# Patient Record
Sex: Female | Born: 1950 | Race: White | Hispanic: No | Marital: Married | Smoking: Never smoker
Health system: Southern US, Community
[De-identification: ages and names within clinical notes are randomized; demographics above are authoritative.]

## PROBLEM LIST (undated history)

## (undated) DIAGNOSIS — E785 Hyperlipidemia, unspecified: Secondary | ICD-10-CM

## (undated) DIAGNOSIS — G4700 Insomnia, unspecified: Secondary | ICD-10-CM

## (undated) DIAGNOSIS — M722 Plantar fascial fibromatosis: Secondary | ICD-10-CM

## (undated) DIAGNOSIS — M8580 Other specified disorders of bone density and structure, unspecified site: Secondary | ICD-10-CM

## (undated) HISTORY — DX: Plantar fascial fibromatosis: M72.2

## (undated) HISTORY — PX: CHOLECYSTECTOMY: SHX55

## (undated) HISTORY — PX: TONSILLECTOMY AND ADENOIDECTOMY: SUR1326

## (undated) HISTORY — DX: Hyperlipidemia, unspecified: E78.5

## (undated) HISTORY — DX: Other specified disorders of bone density and structure, unspecified site: M85.80

## (undated) HISTORY — DX: Insomnia, unspecified: G47.00

## (undated) HISTORY — PX: VAGINAL HYSTERECTOMY: SUR661

---

## 1998-03-29 ENCOUNTER — Other Ambulatory Visit: Admission: RE | Admit: 1998-03-29 | Discharge: 1998-03-29 | Payer: Self-pay | Admitting: Obstetrics and Gynecology

## 1999-04-26 ENCOUNTER — Other Ambulatory Visit: Admission: RE | Admit: 1999-04-26 | Discharge: 1999-04-26 | Payer: Self-pay | Admitting: Obstetrics and Gynecology

## 1999-09-17 ENCOUNTER — Encounter: Payer: Self-pay | Admitting: Obstetrics and Gynecology

## 1999-09-17 ENCOUNTER — Encounter: Admission: RE | Admit: 1999-09-17 | Discharge: 1999-09-17 | Payer: Self-pay | Admitting: Obstetrics and Gynecology

## 1999-11-15 ENCOUNTER — Encounter (INDEPENDENT_AMBULATORY_CARE_PROVIDER_SITE_OTHER): Payer: Self-pay | Admitting: Specialist

## 1999-11-15 ENCOUNTER — Other Ambulatory Visit: Admission: RE | Admit: 1999-11-15 | Discharge: 1999-11-15 | Payer: Self-pay | Admitting: Obstetrics and Gynecology

## 2000-06-03 ENCOUNTER — Other Ambulatory Visit: Admission: RE | Admit: 2000-06-03 | Discharge: 2000-06-03 | Payer: Self-pay | Admitting: Obstetrics and Gynecology

## 2000-10-15 ENCOUNTER — Encounter: Admission: RE | Admit: 2000-10-15 | Discharge: 2000-10-15 | Payer: Self-pay | Admitting: Obstetrics and Gynecology

## 2000-10-15 ENCOUNTER — Encounter: Payer: Self-pay | Admitting: Obstetrics and Gynecology

## 2001-06-08 ENCOUNTER — Other Ambulatory Visit: Admission: RE | Admit: 2001-06-08 | Discharge: 2001-06-08 | Payer: Self-pay | Admitting: Obstetrics and Gynecology

## 2001-09-01 ENCOUNTER — Ambulatory Visit (HOSPITAL_COMMUNITY): Admission: RE | Admit: 2001-09-01 | Discharge: 2001-09-01 | Payer: Self-pay | Admitting: Gastroenterology

## 2001-09-01 ENCOUNTER — Encounter: Payer: Self-pay | Admitting: Gastroenterology

## 2001-10-13 ENCOUNTER — Observation Stay (HOSPITAL_COMMUNITY): Admission: RE | Admit: 2001-10-13 | Discharge: 2001-10-14 | Payer: Self-pay | Admitting: Surgery

## 2001-10-13 ENCOUNTER — Encounter (INDEPENDENT_AMBULATORY_CARE_PROVIDER_SITE_OTHER): Payer: Self-pay

## 2001-11-12 ENCOUNTER — Encounter: Payer: Self-pay | Admitting: Obstetrics and Gynecology

## 2001-11-12 ENCOUNTER — Encounter: Admission: RE | Admit: 2001-11-12 | Discharge: 2001-11-12 | Payer: Self-pay | Admitting: Obstetrics and Gynecology

## 2002-06-23 ENCOUNTER — Other Ambulatory Visit: Admission: RE | Admit: 2002-06-23 | Discharge: 2002-06-23 | Payer: Self-pay | Admitting: Obstetrics and Gynecology

## 2002-08-09 ENCOUNTER — Ambulatory Visit (HOSPITAL_COMMUNITY): Admission: RE | Admit: 2002-08-09 | Discharge: 2002-08-09 | Payer: Self-pay | Admitting: Gastroenterology

## 2002-12-27 ENCOUNTER — Encounter: Admission: RE | Admit: 2002-12-27 | Discharge: 2002-12-27 | Payer: Self-pay | Admitting: Obstetrics and Gynecology

## 2003-07-25 ENCOUNTER — Other Ambulatory Visit: Admission: RE | Admit: 2003-07-25 | Discharge: 2003-07-25 | Payer: Self-pay | Admitting: Obstetrics and Gynecology

## 2004-03-16 ENCOUNTER — Encounter: Admission: RE | Admit: 2004-03-16 | Discharge: 2004-03-16 | Payer: Self-pay | Admitting: Obstetrics and Gynecology

## 2004-09-19 ENCOUNTER — Other Ambulatory Visit: Admission: RE | Admit: 2004-09-19 | Discharge: 2004-09-19 | Payer: Self-pay | Admitting: Obstetrics and Gynecology

## 2005-04-16 ENCOUNTER — Encounter: Admission: RE | Admit: 2005-04-16 | Discharge: 2005-04-16 | Payer: Self-pay | Admitting: Obstetrics and Gynecology

## 2006-05-01 ENCOUNTER — Encounter: Admission: RE | Admit: 2006-05-01 | Discharge: 2006-05-01 | Payer: Self-pay | Admitting: Obstetrics and Gynecology

## 2007-06-16 ENCOUNTER — Encounter: Admission: RE | Admit: 2007-06-16 | Discharge: 2007-06-16 | Payer: Self-pay | Admitting: Internal Medicine

## 2008-06-20 ENCOUNTER — Encounter: Admission: RE | Admit: 2008-06-20 | Discharge: 2008-06-20 | Payer: Self-pay | Admitting: Internal Medicine

## 2009-10-04 ENCOUNTER — Inpatient Hospital Stay (HOSPITAL_COMMUNITY): Admission: RE | Admit: 2009-10-04 | Discharge: 2009-10-05 | Payer: Self-pay | Admitting: Obstetrics and Gynecology

## 2009-10-04 ENCOUNTER — Encounter (INDEPENDENT_AMBULATORY_CARE_PROVIDER_SITE_OTHER): Payer: Self-pay | Admitting: Obstetrics and Gynecology

## 2009-10-24 ENCOUNTER — Encounter: Admission: RE | Admit: 2009-10-24 | Discharge: 2009-10-24 | Payer: Self-pay | Admitting: Obstetrics and Gynecology

## 2009-10-27 ENCOUNTER — Encounter: Admission: RE | Admit: 2009-10-27 | Discharge: 2009-10-27 | Payer: Self-pay | Admitting: Obstetrics and Gynecology

## 2010-03-11 ENCOUNTER — Encounter: Payer: Self-pay | Admitting: Obstetrics and Gynecology

## 2010-05-04 LAB — CBC
MCH: 29.7 pg (ref 26.0–34.0)
MCHC: 33.5 g/dL (ref 30.0–36.0)
MCV: 89.8 fL (ref 78.0–100.0)
Platelets: 250 10*3/uL (ref 150–400)
Platelets: 264 10*3/uL (ref 150–400)
RDW: 13.6 % (ref 11.5–15.5)
RDW: 14 % (ref 11.5–15.5)
WBC: 13 10*3/uL — ABNORMAL HIGH (ref 4.0–10.5)

## 2010-05-04 LAB — SURGICAL PCR SCREEN
MRSA, PCR: NEGATIVE
Staphylococcus aureus: NEGATIVE

## 2010-07-06 NOTE — Op Note (Signed)
   NAME:  Teresa Lamb, Teresa Lamb                      ACCOUNT NO.:  [id number]   MEDICAL RECORD NO.:  [id number]                   PATIENT TYPE:  AMB   LOCATION:  ENDO                                 FACILITY:  [facility]   PHYSICIAN:  Anselmo Rod, M.D.               DATE OF BIRTH:  1951/02/14   DATE OF PROCEDURE:  08/09/2002  DATE OF DISCHARGE:                                 OPERATIVE REPORT   PROCEDURE PERFORMED:  Screening colonoscopy.   ENDOSCOPIST:  Charna Elizabeth, M.D.   INSTRUMENT USED:  Olympus video colonoscope.   INDICATIONS FOR PROCEDURE:  The patient is a 60 year old white female  undergoing screening colonoscopy to rule out colonic polyps, masses, etc.   PREPROCEDURE PREPARATION:  Informed consent was procured from the patient.  The patient was fasted for eight hours prior to the procedure and prepped  with a bottle of magnesium citrate and a gallon of GoLYTELY the night prior  to the procedure.   PREPROCEDURE PHYSICAL:  The patient had stable vital signs.  Neck supple.  Chest clear to auscultation.  S1 and S2 regular.  Abdomen soft with normal  bowel sounds.   DESCRIPTION OF PROCEDURE:  The patient was placed in left lateral decubitus  position and sedated with 40 mg of Demerol and 4 mg of Versed intravenously.  Once the patient was adequately sedated and maintained on low flow oxygen  and continuous cardiac monitoring, the Olympus video colonoscope was  advanced from the rectum to the cecum.  The appendicular orifice and  ileocecal valve were clearly visualized and photographed.  No masses,  polyps, erosions, ulcerations or arteriovenous malformations were seen.  There was evidence of left-sided diverticulosis.  Retroflexion in the rectum  revealed no abnormalities.   IMPRESSION:  Normal colonoscopy up to the cecum except for a few left-sided  diverticula.   RECOMMENDATIONS:  1. A high fiber diet with liberal fluid intake has been advocated.  2. Repeat colorectal  cancer screening is recommended in the next 10 years     unless the patient develops any abnormal symptoms in the interim.  3. Outpatient follow-up as the need arises in the future.                                                   Anselmo Rod, M.D.    JNM/MEDQ  D:  08/09/2002  T:  08/10/2002  Job:  [id number]   cc:   Theressa Millard, M.D.  [address] [place]  [place] [address]  Fax: [phone]

## 2010-07-06 NOTE — Op Note (Signed)
TNAMECHIYEKO, FERRE                     ACCOUNT NO.:  [id number]   MEDICAL RECORD NO.:  [id number]                   PATIENT TYPE:  OBV   LOCATION:  0363                                 FACILITY:  [facility]   PHYSICIAN:  Douglas A. Magnus Ivan, M.D.           DATE OF BIRTH:  [birth year]   DATE OF PROCEDURE:  10/13/2001  DATE OF DISCHARGE:  10/14/2001                                 OPERATIVE REPORT   PREOPERATIVE DIAGNOSES:  Symptomatic cholelithiasis.   POSTOPERATIVE DIAGNOSES:  Symptomatic cholelithiasis.   PROCEDURE:  Laparoscopic cholecystectomy.   SURGEON:  Douglas A. Magnus Ivan, M.D.   ASSISTANT:  Donnie Coffin. Samuella Cota, M.D.   ANESTHESIA:  General endotracheal anesthesia and 0.25% Marcaine.   ESTIMATED BLOOD LOSS:  Minimal.   INDICATIONS FOR PROCEDURE:  Teresa Lamb is a 61 year old female who  presented with right upper quadrant epigastric abdominal pain and nausea  after a fatty meal. She was found on ultrasound to have cholelithiasis with  normal common bile duct. A decision was then made to proceed with the  laparoscopic cholecystectomy.   FINDINGS:  The patient was found to have a deep walled gallbladder with the  appearance of chronic cholecystitis as well as multiple gallstones.   DESCRIPTION OF PROCEDURE:  The patient was brought to the operating room,  identified as Teresa Lamb. She was placed supine on the operating table  and general anesthesia was induced. Her abdomen was then prepped and draped  in the usual sterile fashion. Using a #15 blade, a small transverse incision  was made below the umbilicus. The incision was carried down through the  fascia and opened with the scalpel. A hemostat was then used to pass through  the peritoneal cavity. A #0 Vicryl pursestring suture was then placed around  the fascial opening. The Hasson port was placed through the opening and  insufflation of the abdomen was begun. Next, an 11 mm port was placed in the  patient's epigastrium and two 5 mm ports were placed in the patient's right  flank under direct vision. The gallbladder was then identified and retracted  above the liver bed. It was found to be thick walled. The cystic duct was  then dissected out, clipped twice proximally, once distally and transected  with the scissors. The cystic artery was then identified, clipped twice  proximally, once distally and transected as well. The gallbladder was then  easily dissected free from the liver bed with the electrocautery. Hemostasis  was achieved in the liver bed with the cautery. Once the gallbladder was  completely excised from the liver bed, it was pulled out through the  incision at the umbilicus. The #0 Vicryl __________  in place closing the  fascial defect. Again the liver bed was examined and hemostasis was  achieved. All ports were then removed under direct vision and the abdomen  was deflated. All incisions were then anesthetized with 0.25% Marcaine and  closed with 4-0 Monocryl subcuticular  sutures. Steri-  Strips, gauze and tape were then applied. The patient tolerated the  procedure well. All sponge, needle and instrument counts were correct at the  end of the procedure. The patient was then extubated in the operating room  and taken in stable condition to the recovery room.                                               Douglas A. Magnus Ivan, M.D.    DAB/MEDQ  D:  10/13/2001  T:  10/15/2001  Job:  [id number]

## 2010-10-10 ENCOUNTER — Other Ambulatory Visit: Payer: Self-pay | Admitting: Obstetrics and Gynecology

## 2010-10-10 DIAGNOSIS — Z1231 Encounter for screening mammogram for malignant neoplasm of breast: Secondary | ICD-10-CM

## 2010-11-01 ENCOUNTER — Ambulatory Visit
Admission: RE | Admit: 2010-11-01 | Discharge: 2010-11-01 | Disposition: A | Discharge status: Home / Self Care | Payer: BC Managed Care – PPO | Source: Ambulatory Visit | Attending: Obstetrics and Gynecology | Admitting: Obstetrics and Gynecology

## 2010-11-01 DIAGNOSIS — Z1231 Encounter for screening mammogram for malignant neoplasm of breast: Secondary | ICD-10-CM

## 2011-03-12 ENCOUNTER — Ambulatory Visit: Payer: BC Managed Care – PPO | Attending: Orthopaedic Surgery | Admitting: Physical Therapy

## 2011-03-12 DIAGNOSIS — M25569 Pain in unspecified knee: Secondary | ICD-10-CM | POA: Insufficient documentation

## 2011-03-12 DIAGNOSIS — IMO0001 Reserved for inherently not codable concepts without codable children: Secondary | ICD-10-CM | POA: Insufficient documentation

## 2011-03-15 ENCOUNTER — Ambulatory Visit: Payer: BC Managed Care – PPO | Admitting: Physical Therapy

## 2011-03-20 ENCOUNTER — Ambulatory Visit: Payer: BC Managed Care – PPO | Admitting: Physical Therapy

## 2011-03-28 ENCOUNTER — Ambulatory Visit: Payer: BC Managed Care – PPO | Attending: Orthopaedic Surgery | Admitting: Physical Therapy

## 2011-03-28 DIAGNOSIS — M25569 Pain in unspecified knee: Secondary | ICD-10-CM | POA: Insufficient documentation

## 2011-03-28 DIAGNOSIS — IMO0001 Reserved for inherently not codable concepts without codable children: Secondary | ICD-10-CM | POA: Insufficient documentation

## 2012-01-06 ENCOUNTER — Other Ambulatory Visit: Payer: Self-pay | Admitting: Obstetrics and Gynecology

## 2012-01-06 DIAGNOSIS — Z1231 Encounter for screening mammogram for malignant neoplasm of breast: Secondary | ICD-10-CM

## 2012-01-09 ENCOUNTER — Ambulatory Visit
Admission: RE | Admit: 2012-01-09 | Discharge: 2012-01-09 | Disposition: A | Discharge status: Home / Self Care | Payer: BC Managed Care – PPO | Source: Ambulatory Visit | Attending: Obstetrics and Gynecology | Admitting: Obstetrics and Gynecology

## 2012-01-09 DIAGNOSIS — Z1231 Encounter for screening mammogram for malignant neoplasm of breast: Secondary | ICD-10-CM

## 2013-01-13 ENCOUNTER — Other Ambulatory Visit: Payer: Self-pay

## 2013-01-13 DIAGNOSIS — Z1231 Encounter for screening mammogram for malignant neoplasm of breast: Secondary | ICD-10-CM

## 2013-02-24 ENCOUNTER — Ambulatory Visit: Payer: BC Managed Care – PPO

## 2013-03-01 ENCOUNTER — Ambulatory Visit: Payer: BC Managed Care – PPO

## 2013-03-12 ENCOUNTER — Ambulatory Visit
Admission: RE | Admit: 2013-03-12 | Discharge: 2013-03-12 | Disposition: A | Discharge status: Home / Self Care | Payer: 59 | Source: Ambulatory Visit

## 2013-03-12 DIAGNOSIS — Z1231 Encounter for screening mammogram for malignant neoplasm of breast: Secondary | ICD-10-CM

## 2013-09-09 ENCOUNTER — Ambulatory Visit (INDEPENDENT_AMBULATORY_CARE_PROVIDER_SITE_OTHER): Payer: 59 | Admitting: Sports Medicine

## 2013-09-09 ENCOUNTER — Encounter: Payer: Self-pay | Admitting: Sports Medicine

## 2013-09-09 VITALS — BP 128/82 | Ht 65.0 in | Wt 175.0 lb

## 2013-09-09 DIAGNOSIS — M25579 Pain in unspecified ankle and joints of unspecified foot: Secondary | ICD-10-CM

## 2013-09-09 DIAGNOSIS — M7750 Other enthesopathy of unspecified foot: Secondary | ICD-10-CM

## 2013-09-09 DIAGNOSIS — M25572 Pain in left ankle and joints of left foot: Secondary | ICD-10-CM | POA: Insufficient documentation

## 2013-09-09 NOTE — Progress Notes (Signed)
  Teresa Lamb - 63 y.o. female MRN [id number]  Date of birth: 26-Apr-1950    SUBJECTIVE:     Teresa Lamb comes in today for evaluation of lateral left ankle pain and swelling, which has been occurring for approximately a year.  The pain is described as achy and worse with physical activity.  She denies any trauma prior to the pain, or previous surgeries.  Does note history of right Achilles tendinitis and likely and favored her right leg just prior to her left ankle pain beginning.  Additionally, she notices a sharp, stinging sensation in her lateral leg with some movements.  She is tried Advil with some relief.   Has seen Dr Doran Durand Also has tried to use some OTC insoles Shoes with arches are helpful  Referred by friends in [place] who are patients here  ROS:     No fevers, chills, or erythema  PERTINENT  PMH / PSH FH / / SH:  Past Medical, Surgical, Social, and Family History Reviewed & Updated in the EMR.  Pertinent findings include:   History of right Achilles tendinitis  OBJECTIVE: BP 128/82  Ht 5\' 5"  (1.651 m)  Wt 175 lb (79.379 kg)  BMI 29.12 kg/m2  Physical Exam:  Vital signs are reviewed.  Left Ankle & Foot: Inspection:   Moderate swelling inferior to the lateral malleolus; normal longitudinal arch with collapse upon standing; hammer toes noted on fourth and fifth digits; mod. DJDdeformity with first metatarsal bony enlargement  Mild ankle hallux valgus  Palpation: Lateral ankle tenderness inferior and distal to lateral malleolus  ROM: Full in plantarflexion, dorsiflexion, inversion, and eversion of the foot; normal subtalar motion  Hallux rigidus present: Approximately 5 first MTP extension  Stable lateral and medial ligaments; Negative Anterior drawer test  Gait  Left mid foot pronation with out-toeing  Leg length: equal  Left ankle X-rays reviewed  Arthritic changes in first MTP joint as well as talocuboid joint, as well as lateral soft tissue  swelling  ASSESSMENT & PLAN:  See problem based charting & AVS for pt instructions.

## 2013-09-09 NOTE — Assessment & Plan Note (Addendum)
Patient fitted with  scaphoid pads over her OTC orthotics.  Left foot pronation corrected to neutral with inserts - Advised patient on wearing shoes with good arch support - Fitted with Left ankle compression sleeve - Followup in 3 weeks for custom orthotics  We need to see if she tolerates better arch support To see if compression reduces swelling  PRN meds but may add these if conservative measures are not enough

## 2013-09-16 ENCOUNTER — Ambulatory Visit: Payer: 59 | Admitting: Sports Medicine

## 2013-10-26 ENCOUNTER — Ambulatory Visit (INDEPENDENT_AMBULATORY_CARE_PROVIDER_SITE_OTHER): Payer: 59 | Admitting: Sports Medicine

## 2013-10-26 ENCOUNTER — Encounter: Payer: Self-pay | Admitting: Sports Medicine

## 2013-10-26 VITALS — BP 115/77 | HR 69 | Ht 65.0 in | Wt 175.0 lb

## 2013-10-26 DIAGNOSIS — M25572 Pain in left ankle and joints of left foot: Secondary | ICD-10-CM

## 2013-10-26 DIAGNOSIS — M7750 Other enthesopathy of unspecified foot: Secondary | ICD-10-CM

## 2013-10-26 DIAGNOSIS — R269 Unspecified abnormalities of gait and mobility: Secondary | ICD-10-CM

## 2013-10-26 NOTE — Assessment & Plan Note (Signed)
Patient was fitted for orthotics to help correct her pronated gait a collapsing medial purchase. Orthotics were made and placed the patient in more neutral position. On gait analysis she also has some left external rotation. Provided patient with hip strengthening exercises for her external rotators and abductors.  Recommended follow up in 3 months for reassessment

## 2013-10-26 NOTE — Progress Notes (Signed)
  Teresa Lamb - 62 y.o. female MRN [id number]  Date of birth: [birth year]   SUBJECTIVE:     Teresa Lamb comes in today for evaluation of lateral left ankle pain and swelling, which has been occurring for approximately a year.  The pain is described as achy and worse with physical activity.  She denies any trauma prior to the pain, or previous surgeries. She is tried Advil with some relief. The patient was seen in July and fitted for scaphoid pads for her over-the-counter orthotics. This helped her left foot pronation be corrected to neutral. She also was given ankle compression sleeve. She reports some improvement in her sinus tarsi swelling from ankle compression sleeve. The  scaphoid padding did improve her arch support and took less pressure over her sinus tarsi but did cause some pulling of the ankle towards the lateral aspect of her left foot.  ROS:     No fevers, chills, or erythema  PERTINENT  PMH / PSH FH / / SH:  Past Medical, Surgical, Social, and Family History Reviewed & Updated in the EMR.  Pertinent findings include:   History of right Achilles tendinitis  OBJECTIVE: BP 115/77  Ht 5\' 5"  (1.651 m)  Wt 175 lb (79.379 kg)  BMI 29.12 kg/m2  Physical Exam:  Vital signs are reviewed.  Left Ankle & Foot: Inspection:   Moderate swelling of the sinus tarsi, normal longitudinal arch with collapse upon standing; hammer toes noted on fourth and fifth digits; mod.   DJDdeformity with first metatarsal bony enlargement and valgus position of 1st ray Palpation: Lateral ankle tenderness over sinus tarsi  ROM: Full in plantarflexion, dorsiflexion, inversion, and eversion of the foot; normal subtalar motion  Hallux rigidus present: Approximately 5 first MTP extension  Stable lateral and medial ligaments; Negative Anterior drawer test  Gait  Left mid foot pronation with out-toeing and left leg external rotator and positive Trendelenburg sign  Leg length: equal  Left hip weakness  with external rotators and abductors  Left ankle X-rays reviewed  Arthritic changes in first MTP joint as well as talocuboid joint, as well as lateral soft tissue swelling   ASSESSMENT & PLAN: See problem based charting & AVS for pt instructions.  Orthotic Fitting and Adjustment note: Patient was fitted for a : standard, cushioned, semi-rigid orthotic.  The orthotic was heated and afterward the patient stood on the orthotic blank positioned on the orthotic stand.  The patient was positioned in subtalar neutral position and 10 degrees of ankle dorsiflexion in a weight bearing stance.  After completion of molding, a stable base was applied to the orthotic blank.  The blank was ground to a stable position for weight bearing.  Size: 8 Base: blue foam Posting: none Additional orthotic padding: none  Greater than 50% of the patient's visit was conducted with face-to-face counseling for bilateral foot orthotics fitting and constructing

## 2014-04-28 ENCOUNTER — Other Ambulatory Visit: Payer: Self-pay

## 2014-04-28 DIAGNOSIS — Z1231 Encounter for screening mammogram for malignant neoplasm of breast: Secondary | ICD-10-CM

## 2014-05-03 ENCOUNTER — Ambulatory Visit
Admission: RE | Admit: 2014-05-03 | Discharge: 2014-05-03 | Disposition: A | Discharge status: Home / Self Care | Payer: 59 | Source: Ambulatory Visit

## 2014-05-03 DIAGNOSIS — Z1231 Encounter for screening mammogram for malignant neoplasm of breast: Secondary | ICD-10-CM

## 2014-05-04 ENCOUNTER — Other Ambulatory Visit: Payer: Self-pay | Admitting: Internal Medicine

## 2014-05-04 DIAGNOSIS — R928 Other abnormal and inconclusive findings on diagnostic imaging of breast: Secondary | ICD-10-CM

## 2014-05-06 ENCOUNTER — Ambulatory Visit
Admission: RE | Admit: 2014-05-06 | Discharge: 2014-05-06 | Disposition: A | Discharge status: Home / Self Care | Payer: 59 | Source: Ambulatory Visit | Attending: Internal Medicine | Admitting: Internal Medicine

## 2014-05-06 DIAGNOSIS — R928 Other abnormal and inconclusive findings on diagnostic imaging of breast: Secondary | ICD-10-CM

## 2014-05-11 ENCOUNTER — Other Ambulatory Visit: Payer: Self-pay | Admitting: Internal Medicine

## 2014-05-11 DIAGNOSIS — H539 Unspecified visual disturbance: Secondary | ICD-10-CM

## 2014-05-18 ENCOUNTER — Ambulatory Visit
Admission: RE | Admit: 2014-05-18 | Discharge: 2014-05-18 | Disposition: A | Discharge status: Home / Self Care | Payer: 59 | Source: Ambulatory Visit | Attending: Internal Medicine | Admitting: Internal Medicine

## 2014-05-18 DIAGNOSIS — H539 Unspecified visual disturbance: Secondary | ICD-10-CM

## 2014-06-01 ENCOUNTER — Telehealth: Payer: Self-pay | Admitting: Cardiology

## 2014-06-01 NOTE — Telephone Encounter (Signed)
Received records from [facility] for appointment with Dr Percival Spanish on 07/12/14.  Records given to [facility] (medical records) for Dr Hochrein's schedule on 07/12/14. lp

## 2014-07-12 ENCOUNTER — Ambulatory Visit: Payer: 59 | Admitting: Cardiology

## 2014-07-15 ENCOUNTER — Ambulatory Visit (INDEPENDENT_AMBULATORY_CARE_PROVIDER_SITE_OTHER): Payer: 59 | Admitting: Cardiology

## 2014-07-15 ENCOUNTER — Encounter: Payer: Self-pay | Admitting: Cardiology

## 2014-07-15 VITALS — BP 143/80 | HR 78 | Ht 64.0 in | Wt 177.4 lb

## 2014-07-15 DIAGNOSIS — R072 Precordial pain: Secondary | ICD-10-CM | POA: Diagnosis not present

## 2014-07-15 NOTE — Patient Instructions (Signed)
Your physician recommends that you schedule a follow-up appointment in: AS NEEDED WITH DR. HOCHREIN  We are ordering a stress test for you to get done

## 2014-07-15 NOTE — Progress Notes (Signed)
Cardiology Office Note   Date:  07/15/2014   ID:  Teresa Lamb [birth year], MRN [id number]  PCP:  Dorian Heckle, MD  Cardiologist:   Minus Breeding, MD   Chief Complaint  Patient presents with  . Chest Pain      History of Present Illness: Teresa Lamb is a 65 y.o. female who presents for evaluation of chest discomfort. She has no prior cardiac history.  However, she's had chest discomfort. She doesn't know how long this has been going on. It happens at rest. It is sporadic. It is somewhat of a stable pattern. He describes it as moderate in intensity and lasting about 5 minutes. She does some walking for exercise and cannot bring this on. When she gets it she denies any radiation to her jaw or to her arms. There are no associated symptoms. She otherwise does relatively well. She has some ankle pain which has limited her walking. However, she denies any other significant cardiovascular symptoms. She has no palpitations, presyncope or syncope. She has no PND or orthopnea. She has had some weight gain. She has not had any prior cardiac workup.  Past Medical History  Diagnosis Date  . Insomnia   . Osteopenia   . Plantar fasciitis   . Hyperlipemia     Past Surgical History  Procedure Laterality Date  . Tonsillectomy and adenoidectomy    . Cholecystectomy    . Vaginal hysterectomy       Meds:  None   No current facility-administered medications for this visit.    Allergies:   Penicillins    Social History:  The patient  reports that she has never smoked. She does not have any smokeless tobacco history on file.   Family History:  The patient's family history includes Atrial fibrillation in her father; Hypertension in her mother.    ROS:  Please see the history of present illness.   Otherwise, review of systems are positive for none.   All other systems are reviewed and negative.    PHYSICAL EXAM: VS:  BP 143/80 mmHg  Pulse 78  Ht 5\' 4"  (1.626 m)   Wt 177 lb 6.4 oz (80.468 kg)  BMI 30.44 kg/m2 , BMI Body mass index is 30.44 kg/(m^2). GENERAL:  Well appearing HEENT:  Pupils equal round and reactive, fundi not visualized, oral mucosa unremarkable NECK:  No jugular venous distention, waveform within normal limits, carotid upstroke brisk and symmetric, no bruits, no thyromegaly LYMPHATICS:  No cervical, inguinal adenopathy LUNGS:  Clear to auscultation bilaterally BACK:  No CVA tenderness CHEST:  Unremarkable HEART:  PMI not displaced or sustained,S1 and S2 within normal limits, no S3, no S4, no clicks, no rubs, no murmurs ABD:  Flat, positive bowel sounds normal in frequency in pitch, no bruits, no rebound, no guarding, no midline pulsatile mass, no hepatomegaly, no splenomegaly EXT:  2 plus pulses throughout, no edema, no cyanosis no clubbing SKIN:  No rashes no nodules NEURO:  Cranial nerves II through XII grossly intact, motor grossly intact throughout PSYCH:  Cognitively intact, oriented to person place and time    EKG:  EKG is ordered today. The ekg ordered today demonstrates sinus rhythm, rate 78, axis within normal limits, intervals within normal limits, no acute ST-T wave changes.     Wt Readings from Last 3 Encounters:  07/15/14 177 lb 6.4 oz (80.468 kg)  10/26/13 175 lb (79.379 kg)  09/09/13 175 lb (79.379 kg)      Other  studies Reviewed: Additional studies/ records that were reviewed today include: Outside records. Review of the above records demonstrates:  Please see elsewhere in the note.     ASSESSMENT AND PLAN:  CHEST PAIN:  The pretest prob of obstructive CAD is very low.  I will bring the patient back for a POET (Plain Old Exercise Test). This will allow me to screen for obstructive coronary disease, risk stratify and very importantly provide a prescription for exercise.  We talked about primary risk reduction.  HTN:  She does not carry this diagnosis. Blood pressures borderline. We can watch this at the  time of her stress test. I think she could best be treated with therapeutic lifestyle changes.   Current medicines are reviewed at length with the patient today.  The patient does not have concerns regarding medicines.  The following changes have been made:  no change  Labs/ tests ordered today include:   Orders Placed This Encounter  Procedures  . Exercise Tolerance Test  . EKG 12-Lead     Disposition:   FU with me as needed    Signed, Minus Breeding, MD  07/15/2014 5:23 PM    [place]

## 2014-07-27 ENCOUNTER — Other Ambulatory Visit: Payer: Self-pay | Admitting: Internal Medicine

## 2014-07-27 DIAGNOSIS — E041 Nontoxic single thyroid nodule: Secondary | ICD-10-CM

## 2014-07-29 ENCOUNTER — Other Ambulatory Visit: Payer: 59

## 2014-08-03 ENCOUNTER — Encounter: Payer: Self-pay | Admitting: Cardiology

## 2014-08-03 ENCOUNTER — Ambulatory Visit
Admission: RE | Admit: 2014-08-03 | Discharge: 2014-08-03 | Disposition: A | Discharge status: Home / Self Care | Payer: 59 | Source: Ambulatory Visit | Attending: Internal Medicine | Admitting: Internal Medicine

## 2014-08-03 DIAGNOSIS — E041 Nontoxic single thyroid nodule: Secondary | ICD-10-CM

## 2014-08-12 ENCOUNTER — Telehealth (HOSPITAL_COMMUNITY): Payer: Self-pay

## 2014-08-12 NOTE — Telephone Encounter (Signed)
Encounter complete. 

## 2014-08-15 ENCOUNTER — Encounter (HOSPITAL_COMMUNITY): Payer: Self-pay | Admitting: *Deleted

## 2014-08-16 ENCOUNTER — Telehealth (HOSPITAL_COMMUNITY): Payer: Self-pay

## 2014-08-16 ENCOUNTER — Telehealth (HOSPITAL_COMMUNITY): Payer: Self-pay | Admitting: *Deleted

## 2014-08-16 NOTE — Telephone Encounter (Signed)
Encounter complete. 

## 2014-08-17 ENCOUNTER — Ambulatory Visit (HOSPITAL_COMMUNITY)
Admission: RE | Admit: 2014-08-17 | Discharge: 2014-08-17 | Disposition: A | Discharge status: Home / Self Care | Payer: 59 | Source: Ambulatory Visit | Attending: Internal Medicine | Admitting: Internal Medicine

## 2014-08-17 DIAGNOSIS — R072 Precordial pain: Secondary | ICD-10-CM | POA: Diagnosis not present

## 2014-08-17 LAB — EXERCISE TOLERANCE TEST
CHL RATE OF PERCEIVED EXERTION: 16
Estimated workload: 7.8 METS
Exercise duration (min): 6 min
Exercise duration (sec): 30 s
MPHR: 157 {beats}/min
Peak HR: 141 {beats}/min
Percent HR: 89 %
Rest HR: 83 {beats}/min

## 2014-09-01 ENCOUNTER — Encounter: Payer: Self-pay | Admitting: Sports Medicine

## 2014-09-01 ENCOUNTER — Ambulatory Visit (INDEPENDENT_AMBULATORY_CARE_PROVIDER_SITE_OTHER): Payer: 59 | Admitting: Sports Medicine

## 2014-09-01 VITALS — BP 114/61 | HR 70 | Ht 64.0 in | Wt 177.0 lb

## 2014-09-01 DIAGNOSIS — G5752 Tarsal tunnel syndrome, left lower limb: Secondary | ICD-10-CM | POA: Diagnosis not present

## 2014-09-01 DIAGNOSIS — M79672 Pain in left foot: Secondary | ICD-10-CM | POA: Diagnosis not present

## 2014-09-01 DIAGNOSIS — M25572 Pain in left ankle and joints of left foot: Secondary | ICD-10-CM

## 2014-09-01 NOTE — Assessment & Plan Note (Signed)
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

## 2014-09-01 NOTE — Patient Instructions (Signed)
You have some arthritis in your mid foot and in both great toes  I made an adjustment to your orthotics  I also suggested trying an arch strap  Arnica gel rubbed into the foot several times per day may help with pain  Warm bath soaks at end of day may also help  See how these help and I can try other treatments if we are not getting enough relief

## 2014-09-01 NOTE — Progress Notes (Signed)
Patient ID: Teresa Lamb, female   DOB: 05-02-1950, 64 y.o.   MRN: [id number]  Patient was seen last year and put in orthotics Her x-ray showed some midfoot arthritis She has some chronic swelling in the sinus tarsi The orthotics helped for while but in the last couple months she said an increase in pain This limits her walking Pain is more on the outside of the foot No real swelling No new injury  Physical examination No acute distress BP 114/61 mmHg  Pulse 70  Ht 5\' 4"  (1.626 m)  Wt 177 lb (80.287 kg)  BMI 30.37 kg/m2  Orthotics are intact but on the left side she actually placed an extra insole which makes the leg lengths are unequal walking she is getting too much Supination at the heel  Swelling over the left sinus tarsi more so than the right Normal ankle motion Some tenderness at the talocuboid joint

## 2014-09-06 ENCOUNTER — Encounter: Payer: Self-pay | Admitting: Endocrinology

## 2014-09-06 ENCOUNTER — Ambulatory Visit (INDEPENDENT_AMBULATORY_CARE_PROVIDER_SITE_OTHER): Payer: 59 | Admitting: Endocrinology

## 2014-09-06 VITALS — BP 122/78 | HR 89 | Temp 98.7°F | Resp 16 | Ht 65.0 in | Wt 177.0 lb

## 2014-09-06 DIAGNOSIS — E041 Nontoxic single thyroid nodule: Secondary | ICD-10-CM | POA: Diagnosis not present

## 2014-09-06 LAB — TSH: TSH: 1.53 u[IU]/mL (ref 0.35–4.50)

## 2014-09-06 NOTE — Patient Instructions (Addendum)
Let's check a thyroid biopsy, guided by ultrasound.  If no cancer is found, please come back for a follow-up appointment in 6-12 months.

## 2014-09-06 NOTE — Progress Notes (Signed)
Subjective:    Patient ID: Teresa Lamb, female    DOB: [birth year], 64 y.o.   MRN: [id number]  HPI Pt was noted to have a nodule at the thyroid in early 2016.  He has no h/o XRT or surgery to the neck.  She is unaware of ever having had a thyroid problem before.  She reports slight swelling at the anterior neck, but no assoc neck pain.   Past Medical History  Diagnosis Date  . Insomnia   . Osteopenia   . Plantar fasciitis   . Hyperlipemia     Past Surgical History  Procedure Laterality Date  . Tonsillectomy and adenoidectomy    . Cholecystectomy    . Vaginal hysterectomy      History   Social History  . Marital Status: Married    Spouse Name: N/A  . Number of Children: 3  . Years of Education: N/A   Occupational History  . Not on file.   Social History Main Topics  . Smoking status: Never Smoker   . Smokeless tobacco: Not on file  . Alcohol Use: Not on file  . Drug Use: Not on file  . Sexual Activity: Not on file   Other Topics Concern  . Not on file   Social History Narrative   Lives at home with husband.  3 children.  Six grands and 3 coming.      Current Outpatient Prescriptions on File Prior to Visit  Medication Sig Dispense Refill  . Vitamin D, Ergocalciferol, (DRISDOL) 50000 UNITS CAPS capsule Take 50,000 Units by mouth once a week.  0   No current facility-administered medications on file prior to visit.    Allergies  Allergen Reactions  . Penicillins     Family History  Problem Relation Age of Onset  . Atrial fibrillation Father   . Hypertension Mother   . Thyroid disease Neg Hx     BP 122/78 mmHg  Pulse 89  Temp(Src) 98.7 F (37.1 C) (Oral)  Resp 16  Ht 5\' 5"  (1.651 m)  Wt 177 lb (80.287 kg)  BMI 29.45 kg/m2  SpO2 97%    Review of Systems  HENT: Negative for sore throat.   Cardiovascular: Negative for leg swelling.  Gastrointestinal:       She has slight heartburn  Endocrine: Negative for cold intolerance.  Skin:  Negative for rash.  Neurological: Negative for syncope.   Denies dysphagia, hoarseness, easy bruising, and sob.  She has weight gain.      Objective:   Physical Exam VS: see vs page GEN: no distress HEAD: no periorbital swelling, no proptosis NECK: I cannot feel the thyroid nodule CHEST WALL: no deformity. MUSCULOSKELETAL: muscle bulk and strength are grossly normal.  no obvious joint swelling.  gait is normal and steady NEURO:  cn 2-12 grossly intact.   readily moves all 4's.   SKIN:  Normal texture and temperature.     NODES:  None palpable at the neck PSYCH: alert, well-oriented.  Does not appear anxious nor depressed.   outside test results are reviewed: TSH=normal (April, 2016)  Radiol: thyroid US (08/03/14): The nodule in the right lobe has increased from 13 mm to 17 mm since the 05/18/14 carotid US.    i personally reviewed electrocardiogram tracing (07/15/14): normal    Assessment & Plan:  Multinodular goiter, new to me, uncertain etiology.  i can't feel it well enough to do bx by palpation.    Patient is advised the following:  Patient Instructions  Let's check a thyroid biopsy, guided by ultrasound.  If no cancer is found, please come back for a follow-up appointment in 6-12 months.

## 2014-09-07 ENCOUNTER — Ambulatory Visit
Admission: RE | Admit: 2014-09-07 | Discharge: 2014-09-07 | Disposition: A | Discharge status: Home / Self Care | Payer: 59 | Source: Ambulatory Visit | Attending: Endocrinology | Admitting: Endocrinology

## 2014-09-07 ENCOUNTER — Other Ambulatory Visit (HOSPITAL_COMMUNITY)
Admission: RE | Admit: 2014-09-07 | Discharge: 2014-09-07 | Disposition: A | Discharge status: Home / Self Care | Payer: 59 | Source: Ambulatory Visit | Attending: Interventional Radiology | Admitting: Interventional Radiology

## 2014-09-07 DIAGNOSIS — E041 Nontoxic single thyroid nodule: Secondary | ICD-10-CM | POA: Diagnosis present

## 2014-09-22 ENCOUNTER — Other Ambulatory Visit: Payer: Self-pay | Admitting: Internal Medicine

## 2014-09-22 DIAGNOSIS — E041 Nontoxic single thyroid nodule: Secondary | ICD-10-CM

## 2014-10-12 ENCOUNTER — Ambulatory Visit: Payer: 59 | Admitting: Sports Medicine

## 2015-02-02 ENCOUNTER — Ambulatory Visit (INDEPENDENT_AMBULATORY_CARE_PROVIDER_SITE_OTHER): Payer: 59 | Admitting: Podiatry

## 2015-02-02 ENCOUNTER — Encounter: Payer: Self-pay | Admitting: Podiatry

## 2015-02-02 ENCOUNTER — Ambulatory Visit (INDEPENDENT_AMBULATORY_CARE_PROVIDER_SITE_OTHER): Payer: 59

## 2015-02-02 VITALS — BP 129/78 | HR 74 | Resp 16

## 2015-02-02 DIAGNOSIS — M205X9 Other deformities of toe(s) (acquired), unspecified foot: Secondary | ICD-10-CM | POA: Diagnosis not present

## 2015-02-02 DIAGNOSIS — M7661 Achilles tendinitis, right leg: Secondary | ICD-10-CM | POA: Diagnosis not present

## 2015-02-02 DIAGNOSIS — M779 Enthesopathy, unspecified: Secondary | ICD-10-CM | POA: Diagnosis not present

## 2015-02-02 MED ORDER — TRIAMCINOLONE ACETONIDE 10 MG/ML IJ SUSP
10.0000 mg | Freq: Once | INTRAMUSCULAR | Status: AC
  Administered 2015-02-02: 10 mg

## 2015-02-02 MED ORDER — DICLOFENAC SODIUM 75 MG PO TBEC: 75.0000 mg | DELAYED_RELEASE_TABLET | Freq: Two times a day (BID) | ORAL | Status: AC

## 2015-02-02 NOTE — Patient Instructions (Signed)

## 2015-02-02 NOTE — Progress Notes (Signed)
   Subjective:    Patient ID: Teresa Lamb, female    DOB: [birth year], 64 y.o.   MRN: [id number]  HPI  Pt presents with bilateral ankle and foot pain. Pain on the lateral side of left ankle with swelling and burning sensation in heel. Right foot pain posterior heel, worsened when walking on uneven surfaces and hard surfaces  Review of Systems  All other systems reviewed and are negative.      Objective:   Physical Exam        Assessment & Plan:

## 2015-02-03 NOTE — Progress Notes (Signed)
Subjective:     Patient ID: Teresa Lamb, female   DOB: [birth year], 64 y.o.   MRN: [id number]  HPI patient presents stating I'm having a lot of problems the outside of his left ankle and I might have torn a tendon   Review of Systems  All other systems reviewed and are negative.      Objective:   Physical Exam  Constitutional: She is oriented to person, place, and time.  Cardiovascular: Intact distal pulses.   Musculoskeletal: Normal range of motion.  Neurological: She is oriented to person, place, and time.  Skin: Skin is warm.  Nursing note and vitals reviewed.  neurovascular status is found to be intact muscle strength adequate range of motion within normal limits with patient noted to have discomfort in the lateral side of the left tendon complex with splinting noted upon inversion eversion of the ankle with inflammation and fluid buildup. Patient's found to have good digital perfusion well oriented 3     Assessment:     Lateral tendinitis left into the ankle sinus tarsi and ankle gutter    Plan:     H&P conditions reviewed and careful sheath injection administered 3 Milligan Kenalog 5 mill grams Xylocaine and then due to the intense discomfort air fracture walker administered to completely immobilize. May require MRI if symptoms persist to rule out tear

## 2015-02-16 ENCOUNTER — Encounter: Payer: Self-pay | Admitting: Podiatry

## 2015-02-16 ENCOUNTER — Ambulatory Visit (INDEPENDENT_AMBULATORY_CARE_PROVIDER_SITE_OTHER): Payer: 59 | Admitting: Podiatry

## 2015-02-16 VITALS — BP 135/82 | HR 73 | Resp 16

## 2015-02-16 DIAGNOSIS — M779 Enthesopathy, unspecified: Secondary | ICD-10-CM | POA: Diagnosis not present

## 2015-02-16 DIAGNOSIS — M205X9 Other deformities of toe(s) (acquired), unspecified foot: Secondary | ICD-10-CM | POA: Diagnosis not present

## 2015-02-16 NOTE — Patient Instructions (Signed)

## 2015-02-16 NOTE — Progress Notes (Signed)
Subjective:     Patient ID: Teresa Lamb, female   DOB: Jul 04, 1950, 64 y.o.   MRN: [id number]  HPI patient presents stating I'm still having mild discomfort on the outside of my left ankle and heel but that's improved and I do have problems with my big toe joints bilateral at times   Review of Systems     Objective:   Physical Exam Neurovascular status intact muscle strength adequate with reduced range of motion of the first MPJ bilateral and discomfort still present in the lateral side of the peroneal group left but improved    Assessment:     Hallux limitus deformity bilateral with elevated first metatarsal and tendinitis left    Plan:     Explained relationships of these 2 conditions and that ultimately hallux limitus surgery may be necessary. Today I went ahead and I did dispense fascial brace to elevate the lateral side of the left ankle and patient tolerated this well along with ice therapy

## 2015-04-19 ENCOUNTER — Other Ambulatory Visit: Payer: Self-pay

## 2015-04-19 DIAGNOSIS — Z1231 Encounter for screening mammogram for malignant neoplasm of breast: Secondary | ICD-10-CM

## 2015-05-09 ENCOUNTER — Ambulatory Visit
Admission: RE | Admit: 2015-05-09 | Discharge: 2015-05-09 | Disposition: A | Discharge status: Home / Self Care | Payer: 59 | Source: Ambulatory Visit

## 2015-05-09 DIAGNOSIS — Z1231 Encounter for screening mammogram for malignant neoplasm of breast: Secondary | ICD-10-CM

## 2015-08-07 ENCOUNTER — Other Ambulatory Visit: Payer: 59

## 2015-08-14 ENCOUNTER — Ambulatory Visit
Admission: RE | Admit: 2015-08-14 | Discharge: 2015-08-14 | Disposition: A | Discharge status: Home / Self Care | Payer: 59 | Source: Ambulatory Visit | Attending: Internal Medicine | Admitting: Internal Medicine

## 2015-08-14 DIAGNOSIS — E041 Nontoxic single thyroid nodule: Secondary | ICD-10-CM

## 2015-12-11 ENCOUNTER — Ambulatory Visit: Payer: 59 | Attending: Orthopaedic Surgery | Admitting: Physical Therapy

## 2015-12-11 ENCOUNTER — Encounter: Payer: Self-pay | Admitting: Physical Therapy

## 2015-12-11 DIAGNOSIS — R262 Difficulty in walking, not elsewhere classified: Secondary | ICD-10-CM | POA: Diagnosis present

## 2015-12-11 DIAGNOSIS — R2241 Localized swelling, mass and lump, right lower limb: Secondary | ICD-10-CM | POA: Diagnosis present

## 2015-12-11 DIAGNOSIS — M25671 Stiffness of right ankle, not elsewhere classified: Secondary | ICD-10-CM | POA: Diagnosis present

## 2015-12-11 DIAGNOSIS — M25571 Pain in right ankle and joints of right foot: Secondary | ICD-10-CM | POA: Diagnosis present

## 2015-12-11 NOTE — Therapy (Signed)
[address] [place], [place], [address] Phone: [phone]   Fax:  [phone]  Physical Therapy Evaluation  Patient Details  Name: Teresa Lamb MRN: [id number] Date of Birth: June 16, 1950 Referring Provider: Clyde Canterbury  Encounter Date: 12/11/2015      PT End of Session - 12/11/15 1558    Visit Number 1   PT Start Time 1522   PT Stop Time 1608   PT Time Calculation (min) 46 min   Activity Tolerance Patient tolerated treatment well   Behavior During Therapy [facility] for tasks assessed/performed      Past Medical History:  Diagnosis Date  . Hyperlipemia   . Insomnia   . Osteopenia   . Plantar fasciitis     Past Surgical History:  Procedure Laterality Date  . CHOLECYSTECTOMY    . TONSILLECTOMY AND ADENOIDECTOMY    . VAGINAL HYSTERECTOMY      There were no vitals filed for this visit.       Subjective Assessment - 12/11/15 1534    Subjective Patient reports right foot pain since December.  She had an injection at that time, with some relief for about 2 months, she was also in a boot.  Then the pain returned, with worse pain.  She has been diagnosed with Haglund's deformity with achilles tendoinits and possible bursitis   Limitations Walking   How long can you walk comfortably? 30 minutes, shopping also bothers it   Patient Stated Goals have less pain   Currently in Pain? Yes   Pain Score 1    Pain Location Ankle   Pain Orientation Right;Posterior;Distal   Pain Descriptors / Indicators Tightness;Sharp   Pain Type Chronic pain   Pain Onset More than a month ago   Pain Frequency Intermittent   Aggravating Factors  after sitting long periods and the first thing in the AM pain is 6/10, lifting heavy items   Pain Relieving Factors the shot and the boot helped   Effect of Pain on Daily Activities limits walking, pain with first getting up            [facility] PT Assessment - 12/11/15 0001       Assessment   Medical Diagnosis right achilles tendonits   Referring Provider [place]   Onset Date/Surgical Date 11/11/15   Prior Therapy no     Precautions   Precautions None     Balance Screen   Has the patient fallen in the past 6 months No   Has the patient had a decrease in activity level because of a fear of falling?  No   Is the patient reluctant to leave their home because of a fear of falling?  No     Home Environment   Additional Comments has stairs at home, some difficulty going down, does housework, yardwork, reports difficulty on uneven surfaces     Prior Function   Level of Independence Independent   Vocation Retired   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   Leisure did not exercise     ROM / Strength   AROM / PROM / Strength AROM;PROM;Strength     AROM   Overall AROM Comments c/o stiffness with ankle motions   AROM Assessment Site Ankle   Right/Left Ankle Right   Right Ankle Dorsiflexion 4   Right Ankle Plantar Flexion 30   Right Ankle Inversion 20   Right Ankle Eversion 10  PROM   Overall PROM Comments PROM was WNL's except for DF was to 10 degrees with c/o calf tightness     Strength   Overall Strength Comments 4+/5 without pain     Flexibility   Soft Tissue Assessment /Muscle Length --  tightness of the calves     Palpation   Palpation comment large knot right achilles area, has a callus on the right lateral heel, she is very tender in the achilles especially the knot     Ambulation/Gait   Gait Comments decrease toe off, decreaesd stance phase ont he right, antalgic on the right     High Level Balance   High Level Balance Comments unable to stand on single leg > 3 seconds                   [facility] Adult PT Treatment/Exercise - 12/11/15 0001      Modalities   Modalities Iontophoresis     Iontophoresis   Type of Iontophoresis Dexamethasone   Location right achilles    Dose 33mA    Time 4 hour patch #1                PT Education - 12/11/15 1556    Education provided Yes   Education Details calf stretches   Person(s) Educated Patient   Methods Explanation;Demonstration;Handout   Comprehension Verbalized understanding          PT Short Term Goals - 12/11/15 1637      PT SHORT TERM GOAL #1   Title independent with initial HEP   Time 2   Period Weeks   Status New           PT Long Term Goals - 12/11/15 1637      PT LONG TERM GOAL #1   Title understand Arts development officer as they pertain to the foot   Time 8   Period Weeks   Status New     PT LONG TERM GOAL #2   Title increase AROM of DF to 10 degrees   Time 8   Period Weeks   Status New     PT LONG TERM GOAL #3   Title decrease pain 50%   Time 8   Period Weeks   Status New     PT LONG TERM GOAL #4   Title tolerate walking 1 hour and/or report able to shop at [facility] without pain >2/10   Time 8   Period Weeks   Status New               Plan - 12/11/15 1634    Clinical Impression Statement Patient with a Haglund's deformity of the right heel causing tendonits/ bursitis, she has a large knot in this area, she is tender, her gait is with decreased stance phase and toe off, her AROM for DF was 4 degrees, very tight in the calves   Rehab Potential Good   PT Frequency 2x / week   PT Duration 8 weeks   PT Treatment/Interventions Cryotherapy;Electrical Stimulation;Iontophoresis 4mg /ml Dexamethasone;Functional mobility training;Stair training;Gait training;Ultrasound;Moist Heat;Therapeutic activities;Therapeutic exercise;Balance training;Neuromuscular re-education;Patient/family education;Manual techniques;Taping;Vasopneumatic Device   PT Next Visit Plan slowly add exercises, could try some STM of the calf and achilles   Consulted and Agree with Plan of Care Patient      Patient will benefit from skilled therapeutic intervention in order to improve the following deficits and  impairments:  Abnormal gait, Decreased activity tolerance, Decreased balance, Decreased mobility, Decreased strength, Decreased range  of motion, Difficulty walking, Impaired flexibility, Pain, Improper body mechanics  Visit Diagnosis: Pain in right ankle and joints of right foot - Plan: PT plan of care cert/re-cert  Difficulty in walking, not elsewhere classified - Plan: PT plan of care cert/re-cert  Stiffness of right ankle, not elsewhere classified - Plan: PT plan of care cert/re-cert  Localized swelling, mass and lump, right lower limb - Plan: PT plan of care cert/re-cert     Problem List Patient Active Problem List   Diagnosis Date Noted  . Right thyroid nodule 09/06/2014  . Precordial chest pain 07/15/2014  . Abnormality of gait 10/26/2013  . Sinus tarsi syndrome of left ankle 09/09/2013    Sumner Boast., PT 12/11/2015, 4:44 PM  [address] [place], [place], [address] Phone: [phone]   Fax:  [phone]  Name: KELENA TIBURCIO MRN: [id number] Date of Birth: 15-Jul-1950

## 2015-12-15 ENCOUNTER — Ambulatory Visit: Payer: 59 | Admitting: Physical Therapy

## 2015-12-15 DIAGNOSIS — R262 Difficulty in walking, not elsewhere classified: Secondary | ICD-10-CM

## 2015-12-15 DIAGNOSIS — M25571 Pain in right ankle and joints of right foot: Secondary | ICD-10-CM

## 2015-12-15 DIAGNOSIS — R2241 Localized swelling, mass and lump, right lower limb: Secondary | ICD-10-CM

## 2015-12-15 DIAGNOSIS — M25671 Stiffness of right ankle, not elsewhere classified: Secondary | ICD-10-CM

## 2015-12-15 NOTE — Therapy (Signed)
[address] [place], [place], [address] Phone: [phone]   Fax:  [phone]  Physical Therapy Treatment  Patient Details  Name: Teresa Lamb MRN: [id number] Date of Birth: 12/16/1950 Referring Provider: Clyde Canterbury  Encounter Date: 12/15/2015      PT End of Session - 12/15/15 0859    Visit Number 2   Date for PT Re-Evaluation 02/09/16   PT Start Time 0800   PT Stop Time 0841   PT Time Calculation (min) 41 min   Activity Tolerance Patient tolerated treatment well   Behavior During Therapy [facility] for tasks assessed/performed      Past Medical History:  Diagnosis Date  . Hyperlipemia   . Insomnia   . Osteopenia   . Plantar fasciitis     Past Surgical History:  Procedure Laterality Date  . CHOLECYSTECTOMY    . TONSILLECTOMY AND ADENOIDECTOMY    . VAGINAL HYSTERECTOMY      There were no vitals filed for this visit.      Subjective Assessment - 12/15/15 0803    Subjective Patient reporting that she has been doing stretches. Reports pain is not going away. Walking in the morning is still painful and tight.    Limitations Walking   Patient Stated Goals have less pain   Currently in Pain? No/denies   Pain Score 0-No pain                         [facility] Adult PT Treatment/Exercise - 12/15/15 0846      Exercises   Exercises --     Modalities   Modalities Iontophoresis     Iontophoresis   Type of Iontophoresis Dexamethasone   Location right achilles    Dose 57mA   Time 4 hour patch #2     Manual Therapy   Manual Therapy Soft tissue mobilization;Passive ROM   Soft tissue mobilization R posterior calf with patient in prone with stretch into dorsiflexion   Passive ROM passive stretching of gastroc and soleus in prone 3x30" each     Ankle Exercises: Stretches   Soleus Stretch 3 reps;30 seconds   Soleus Stretch Limitations patient reporitng minimal stretch   Gastroc  Stretch 3 reps;30 seconds   Gastroc Stretch Limitations with heels off of 4 inch step; no increase in pain      Ankle Exercises: Standing   Heel Raises 15 reps   Heel Raises Limitations eccentric heel raises (2 up, 1 down) on R LE x 15 reps     Ankle Exercises: Aerobic   Stationary Bike Nustep: level 4 x 5'                  PT Short Term Goals - 12/11/15 1637      PT SHORT TERM GOAL #1   Title independent with initial HEP   Time 2   Period Weeks   Status New           PT Long Term Goals - 12/11/15 1637      PT LONG TERM GOAL #1   Title understand Arts development officer as they pertain to the foot   Time 8   Period Weeks   Status New     PT LONG TERM GOAL #2   Title increase AROM of DF to 10 degrees   Time 8   Period Weeks   Status New     PT LONG TERM GOAL #  3   Title decrease pain 50%   Time 8   Period Weeks   Status New     PT LONG TERM GOAL #4   Title tolerate walking 1 hour and/or report able to shop at [facility] without pain >2/10   Time 8   Period Weeks   Status New               Plan - 12/15/15 0859    Clinical Impression Statement Patient with good compliance of stretching tasks at home. Progression today of heel raises as well as eccentric heel raises of R LE with no increase in pain. Soft tissue demonstrating reduced tissue extensibility with patient responding well to soft tissue mobilization with patient subjective reports of reduced tightness.    PT Treatment/Interventions Cryotherapy;Electrical Stimulation;Iontophoresis 4mg /ml Dexamethasone;Functional mobility training;Stair training;Gait training;Ultrasound;Moist Heat;Therapeutic activities;Therapeutic exercise;Balance training;Neuromuscular re-education;Patient/family education;Manual techniques;Taping;Vasopneumatic Device   PT Next Visit Plan continue to progress exercises, STM of R calf/achilles for improved tissue extensibility.    Consulted and Agree with Plan of Care Patient       Patient will benefit from skilled therapeutic intervention in order to improve the following deficits and impairments:  Abnormal gait, Decreased activity tolerance, Decreased balance, Decreased mobility, Decreased strength, Decreased range of motion, Difficulty walking, Impaired flexibility, Pain, Improper body mechanics  Visit Diagnosis: Pain in right ankle and joints of right foot  Difficulty in walking, not elsewhere classified  Stiffness of right ankle, not elsewhere classified  Localized swelling, mass and lump, right lower limb     Problem List Patient Active Problem List   Diagnosis Date Noted  . Right thyroid nodule 09/06/2014  . Precordial chest pain 07/15/2014  . Abnormality of gait 10/26/2013  . Sinus tarsi syndrome of left ankle 09/09/2013     Lanney Gins, PT, DPT 12/15/15 9:12 AM   [address] [place], [place], [address] Phone: [phone]   Fax:  [phone]  Name: KHAWLA BROADAWAY MRN: [id number] Date of Birth: 05-Oct-1950

## 2015-12-20 ENCOUNTER — Encounter: Payer: Self-pay | Admitting: Physical Therapy

## 2015-12-20 ENCOUNTER — Ambulatory Visit: Payer: 59 | Attending: Orthopaedic Surgery | Admitting: Physical Therapy

## 2015-12-20 DIAGNOSIS — M25571 Pain in right ankle and joints of right foot: Secondary | ICD-10-CM

## 2015-12-20 DIAGNOSIS — R2241 Localized swelling, mass and lump, right lower limb: Secondary | ICD-10-CM | POA: Diagnosis present

## 2015-12-20 DIAGNOSIS — R262 Difficulty in walking, not elsewhere classified: Secondary | ICD-10-CM | POA: Diagnosis present

## 2015-12-20 DIAGNOSIS — M25671 Stiffness of right ankle, not elsewhere classified: Secondary | ICD-10-CM

## 2015-12-20 NOTE — Therapy (Signed)
[address] [place], [place], [address] Phone: [phone]   Fax:  [phone]  Physical Therapy Treatment  Patient Details  Name: Teresa Lamb MRN: [id number] Date of Birth: 29-Jul-1950 Referring Provider: Clyde Canterbury  Encounter Date: 12/20/2015      PT End of Session - 12/20/15 0935    Visit Number 3   Date for PT Re-Evaluation 02/09/16   PT Start Time 0845   PT Stop Time 0930   PT Time Calculation (min) 45 min   Activity Tolerance Patient tolerated treatment well;No increased pain   Behavior During Therapy [facility] for tasks assessed/performed      Past Medical History:  Diagnosis Date  . Hyperlipemia   . Insomnia   . Osteopenia   . Plantar fasciitis     Past Surgical History:  Procedure Laterality Date  . CHOLECYSTECTOMY    . TONSILLECTOMY AND ADENOIDECTOMY    . VAGINAL HYSTERECTOMY      There were no vitals filed for this visit.      Subjective Assessment - 12/20/15 0848    Subjective Patient reports no pai this morning. She states that the heel hurts when she first wakes up and puts pressure throuhg the foot however the pain goes away within the first hour if shes "up and walking".   Limitations Walking;Standing   How long can you walk comfortably? 30 minutes, shopping also bothers it   Patient Stated Goals have less pain   Currently in Pain? No/denies   Pain Score 0-No pain   Pain Location Ankle   Pain Orientation Right;Posterior;Distal   Pain Descriptors / Indicators Sharp;Tightness   Pain Type Chronic pain   Pain Onset More than a month ago                         [facility] Adult PT Treatment/Exercise - 12/20/15 0001      Modalities   Modalities Iontophoresis     Iontophoresis   Type of Iontophoresis Dexamethasone   Location right achilles   Dose 11mA   Time 4 hr patch     Manual Therapy   Manual Therapy Other (comment);Passive ROM  Strain counterstrain  technique on Rt gastroc      Ankle Exercises: Seated   ABC's 1 rep   Towel Inversion/Eversion 5 reps;Weights  2 sets, last 2 sets used2 pound weight     Ankle Exercises: Aerobic   Stationary Bike lifefitness bike     Ankle Exercises: Stretches   Gastroc Stretch 5 reps;30 seconds   Gastroc Stretch Limitations 2 inch bar, bilateral gastrocs     Ankle Exercises: Standing   BAPS Standing  4 way, 30 reps each   Other Standing Ankle Exercises DF assist, 10 reps, hold 5 sec, 8 inch step                PT Education - 12/20/15 0935    Education provided No          PT Short Term Goals - 12/11/15 1637      PT SHORT TERM GOAL #1   Title independent with initial HEP   Time 2   Period Weeks   Status New           PT Long Term Goals - 12/11/15 1637      PT LONG TERM GOAL #1   Title understand Arts development officer as they pertain to the foot   Time  8   Period Weeks   Status New     PT LONG TERM GOAL #2   Title increase AROM of DF to 10 degrees   Time 8   Period Weeks   Status New     PT LONG TERM GOAL #3   Title decrease pain 50%   Time 8   Period Weeks   Status New     PT LONG TERM GOAL #4   Title tolerate walking 1 hour and/or report able to shop at [facility] without pain >2/10   Time 8   Period Weeks   Status New               Plan - 12/20/15 0936    Clinical Impression Statement Patient tolerated tretament well and was able to complete all exercises. Patient however did voice "discomfort" on the distal portion of her rt quad muscle with the active DF stretch. Pt reports feeling "looser" in the ankle joint. Pt still has a noticeable bump over the posterior calcaneus and site of the acilles. It is warm to the touch as well. Continue to progress per pt tolerance.   Rehab Potential Good   PT Frequency 2x / week   PT Duration 8 weeks   PT Treatment/Interventions Cryotherapy;Electrical Stimulation;Iontophoresis 4mg /ml Dexamethasone;Functional mobility  training;Stair training;Gait training;Ultrasound;Moist Heat;Therapeutic activities;Therapeutic exercise;Balance training;Neuromuscular re-education;Patient/family education;Manual techniques;Taping;Vasopneumatic Device   PT Next Visit Plan continue to progress exercises, STM of R calf/achilles for improved tissue extensibility, educate pt on what a bone spur is, soft tissue work on rt gastroc and distal attachment site.   Consulted and Agree with Plan of Care Patient      Patient will benefit from skilled therapeutic intervention in order to improve the following deficits and impairments:  Abnormal gait, Decreased activity tolerance, Decreased balance, Decreased mobility, Decreased strength, Decreased range of motion, Difficulty walking, Impaired flexibility, Pain, Improper body mechanics  Visit Diagnosis: Pain in right ankle and joints of right foot  Difficulty in walking, not elsewhere classified  Stiffness of right ankle, not elsewhere classified  Localized swelling, mass and lump, right lower limb     Problem List Patient Active Problem List   Diagnosis Date Noted  . Right thyroid nodule 09/06/2014  . Precordial chest pain 07/15/2014  . Abnormality of gait 10/26/2013  . Sinus tarsi syndrome of left ankle 09/09/2013    Toy Baker, SPT 12/20/2015, 9:41 AM  [address] [place], [place], [address] Phone: [phone]   Fax:  [phone]  Name: Teresa Lamb MRN: [id number] Date of Birth: May 12, 1950

## 2015-12-22 ENCOUNTER — Ambulatory Visit: Payer: 59 | Admitting: Physical Therapy

## 2015-12-25 ENCOUNTER — Encounter: Payer: Self-pay | Admitting: Physical Therapy

## 2015-12-25 ENCOUNTER — Ambulatory Visit: Payer: 59 | Admitting: Physical Therapy

## 2015-12-25 DIAGNOSIS — M25571 Pain in right ankle and joints of right foot: Secondary | ICD-10-CM | POA: Diagnosis not present

## 2015-12-25 DIAGNOSIS — M25671 Stiffness of right ankle, not elsewhere classified: Secondary | ICD-10-CM

## 2015-12-25 DIAGNOSIS — R262 Difficulty in walking, not elsewhere classified: Secondary | ICD-10-CM

## 2015-12-25 DIAGNOSIS — R2241 Localized swelling, mass and lump, right lower limb: Secondary | ICD-10-CM

## 2015-12-25 NOTE — Therapy (Signed)
[address] [place], [place], [address] Phone: [phone]   Fax:  [phone]  Physical Therapy Treatment  Patient Details  Name: Teresa Lamb MRN: [id number] Date of Birth: 07/06/1950 Referring Provider: Clyde Canterbury  Encounter Date: 12/25/2015      PT End of Session - 12/25/15 1544    Visit Number 4   Date for PT Re-Evaluation 02/09/16   PT Start Time 5462   PT Stop Time 7035   PT Time Calculation (min) 29 min   Activity Tolerance Patient tolerated treatment well;No increased pain   Behavior During Therapy [facility]FL for tasks assessed/performed      Past Medical History:  Diagnosis Date  . Hyperlipemia   . Insomnia   . Osteopenia   . Plantar fasciitis     Past Surgical History:  Procedure Laterality Date  . CHOLECYSTECTOMY    . TONSILLECTOMY AND ADENOIDECTOMY    . VAGINAL HYSTERECTOMY      There were no vitals filed for this visit.      Subjective Assessment - 12/25/15 1514    Subjective Pt reports that things are going all right.   Pain Score 0-No pain            [facility] PT Assessment - 12/25/15 0001      AROM   AROM Assessment Site Ankle   Right/Left Ankle Right   Right Ankle Dorsiflexion 6   Right Ankle Plantar Flexion 68   Right Ankle Inversion 30   Right Ankle Eversion 20                     [facility] Adult PT Treatment/Exercise - 12/25/15 0001      Modalities   Modalities Iontophoresis     Iontophoresis   Type of Iontophoresis Dexamethasone   Location right achilles   Dose 46m   Time 4 hr patch     Ankle Exercises: Aerobic   Stationary Bike lifefitness bike x 6 min      Ankle Exercises: Seated   ABC's 1 rep   Other Seated Ankle Exercises 4 way ankle Tband blue x15     Ankle Exercises: Stretches   Gastroc Stretch 3 reps;30 seconds     Ankle Exercises: Standing   Other Standing Ankle Exercises Forward lean RLE on 8 in box 2x10                   PT Short Term Goals - 12/11/15 1637      PT SHORT TERM GOAL #1   Title independent with initial HEP   Time 2   Period Weeks   Status New           PT Long Term Goals - 12/11/15 1637      PT LONG TERM GOAL #1   Title understand bArts development officeras they pertain to the foot   Time 8   Period Weeks   Status New     PT LONG TERM GOAL #2   Title increase AROM of DF to 10 degrees   Time 8   Period Weeks   Status New     PT LONG TERM GOAL #3   Title decrease pain 50%   Time 8   Period Weeks   Status New     PT LONG TERM GOAL #4   Title tolerate walking 1 hour and/or [facility]   Time 8  Period Weeks   Status New               Plan - 12/25/15 1545    Clinical Impression Statement Pt does voice some concern because the MD wanted her to attend therapy 5 days a week. Pt also voiced that she has not met her deductible. Pt  able to perform all of today's exercises well. Pt has able increase her R ankle ROM in all directions. Pt R ankle does remain limited with DF.   Rehab Potential Good   PT Frequency 2x / week   PT Duration 8 weeks   PT Treatment/Interventions Cryotherapy;Electrical Stimulation;Iontophoresis 59m/ml Dexamethasone;Functional mobility training;Stair training;Gait training;Ultrasound;Moist Heat;Therapeutic activities;Therapeutic exercise;Balance training;Neuromuscular re-education;Patient/family education;Manual techniques;Taping;Vasopneumatic Device   PT Next Visit Plan continue to progress exercises, STM of R calf/achilles for improved tissue extensibility, educate pt on what a bone spur is, soft tissue work on rt gastro and distal attachment site.      Patient will benefit from skilled therapeutic intervention in order to improve the following deficits and impairments:  Abnormal gait, Decreased activity tolerance, Decreased balance, Decreased mobility, Decreased strength, Decreased range of  motion, Difficulty walking, Impaired flexibility, Pain, Improper body mechanics  Visit Diagnosis: Pain in right ankle and joints of right foot  Difficulty in walking, not elsewhere classified  Stiffness of right ankle, not elsewhere classified  Localized swelling, mass and lump, right lower limb     Problem List Patient Active Problem List   Diagnosis Date Noted  . Right thyroid nodule 09/06/2014  . Precordial chest pain 07/15/2014  . Abnormality of gait 10/26/2013  . Sinus tarsi syndrome of left ankle 09/09/2013    RScot Jun PTA  12/25/2015, 3:50 PM  [address] [address] [address]: [phone]  Fax:  [phone] Name: BENEDINA PAIRMRN: [id number] of Birth: 108-21-1952

## 2015-12-28 ENCOUNTER — Ambulatory Visit: Payer: 59 | Admitting: Physical Therapy

## 2015-12-28 ENCOUNTER — Encounter: Payer: Self-pay | Admitting: Physical Therapy

## 2015-12-28 DIAGNOSIS — M25571 Pain in right ankle and joints of right foot: Secondary | ICD-10-CM | POA: Diagnosis not present

## 2015-12-28 DIAGNOSIS — R2241 Localized swelling, mass and lump, right lower limb: Secondary | ICD-10-CM

## 2015-12-28 DIAGNOSIS — R262 Difficulty in walking, not elsewhere classified: Secondary | ICD-10-CM

## 2015-12-28 DIAGNOSIS — M25671 Stiffness of right ankle, not elsewhere classified: Secondary | ICD-10-CM

## 2015-12-28 NOTE — Therapy (Signed)
[address] [place], [place], [address] Phone: [phone]   Fax:  [phone]  Physical Therapy Treatment  Patient Details  Name: Teresa Lamb MRN: [id number] Date of Birth: 30-Oct-1950 Referring Provider: Clyde Canterbury  Encounter Date: 12/28/2015      PT End of Session - 12/28/15 0925    Visit Number 5   Date for PT Re-Evaluation 02/09/16   PT Start Time 0850   PT Stop Time 0935   PT Time Calculation (min) 45 min   Activity Tolerance Patient tolerated treatment well;No increased pain   Behavior During Therapy WFL for tasks assessed/performed      Past Medical History:  Diagnosis Date  . Hyperlipemia   . Insomnia   . Osteopenia   . Plantar fasciitis     Past Surgical History:  Procedure Laterality Date  . CHOLECYSTECTOMY    . TONSILLECTOMY AND ADENOIDECTOMY    . VAGINAL HYSTERECTOMY      There were no vitals filed for this visit.      Subjective Assessment - 12/28/15 0920    Subjective Patient reports no pain this morning. She states that the pain increases most when she stands and walks after sitting for extended periods of time.   Limitations Walking;Standing   Currently in Pain? No/denies   Pain Score 0-No pain   Pain Location Ankle   Pain Orientation Posterior;Distal;Right   Pain Descriptors / Indicators Sharp;Tightness   Pain Type Chronic pain   Pain Onset More than a month ago   Multiple Pain Sites No                         [facility] Adult PT Treatment/Exercise - 12/28/15 0001      Modalities   Modalities Cryotherapy     Cryotherapy   Number Minutes Cryotherapy 10 Minutes   Cryotherapy Location Ankle   Type of Cryotherapy Ice pack     Iontophoresis   Type of Iontophoresis Dexamethasone   Location right achilles   Time 4 hr patch     Ankle Exercises: Seated   Towel Inversion/Eversion Other (comment)  10 reps each way, 2.5 lbs   Heel Raises 20 reps    Other Seated Ankle Exercises 4 WAY ANKLE, blue tband     Ankle Exercises: Stretches   Gastroc Stretch 30 seconds;3 reps     Ankle Exercises: Standing   Heel Raises 20 reps     Ankle Exercises: Aerobic   Tread Mill 9 minutes  pt began to feel pain                PT Education - 12/28/15 0924    Education provided Yes   Education Details HEP on ankle 4 way exercise with tband. Dixie cup ice massage   Person(s) Educated Patient   Methods Explanation;Handout   Comprehension Verbalized understanding;Returned demonstration          PT Short Term Goals - 12/28/15 0933      PT SHORT TERM GOAL #1   Status Achieved           PT Long Term Goals - 12/28/15 0933      PT LONG TERM GOAL #1   Status On-going     PT LONG TERM GOAL #2   Status Partially Met     PT LONG TERM GOAL #3   Status Partially Met  at best: 0/10, at worst: 6  Plan - 12/28/15 0925    Clinical Impression Statement Patient tolerated treatment well and was able to complete all exercises. Patient states that she mainly feels the discomfort/pain when she sits for an extended period of time and then stands up to walk. Patient has reported decreased symptoms since coming to therapy. Treadmill exercise was done today and patient was bale to ambulate for 9 minutes before her symptoms came on. Pateint states that before the onset of pain, she would walk on average of 30 minutes at a time. Patient was given instruction on ice therapy for home that involves freezing water in dixie cups and rubbing the inflamed/swollen area on her heel.   Rehab Potential Good      Patient will benefit from skilled therapeutic intervention in order to improve the following deficits and impairments:  Abnormal gait, Decreased activity tolerance, Decreased balance, Decreased mobility, Decreased strength, Decreased range of motion, Difficulty walking, Impaired flexibility, Pain, Improper body mechanics  Visit  Diagnosis: Pain in right ankle and joints of right foot  Difficulty in walking, not elsewhere classified  Stiffness of right ankle, not elsewhere classified  Localized swelling, mass and lump, right lower limb     Problem List Patient Active Problem List   Diagnosis Date Noted  . Right thyroid nodule 09/06/2014  . Precordial chest pain 07/15/2014  . Abnormality of gait 10/26/2013  . Sinus tarsi syndrome of left ankle 09/09/2013    Toy Baker, SPT 12/28/2015, 9:55 AM  [address] [place], [place], [address] Phone: [phone]   Fax:  [phone]  Name: Teresa Lamb MRN: [id number] Date of Birth: Nov 20, 1950

## 2015-12-28 NOTE — Therapy (Signed)
[address] [place], [place], [address] Phone: [phone]   Fax:  [phone]  Physical Therapy Treatment  Patient Details  Name: Teresa Lamb MRN: [id number] Date of Birth: February 21, 1950 Referring Provider: Clyde Canterbury  Encounter Date: 12/28/2015      PT End of Session - 12/28/15 0925    Visit Number 5   Date for PT Re-Evaluation 02/09/16   PT Start Time 0850   PT Stop Time 0935   PT Time Calculation (min) 45 min   Activity Tolerance Patient tolerated treatment well;No increased pain   Behavior During Therapy WFL for tasks assessed/performed      Past Medical History:  Diagnosis Date  . Hyperlipemia   . Insomnia   . Osteopenia   . Plantar fasciitis     Past Surgical History:  Procedure Laterality Date  . CHOLECYSTECTOMY    . TONSILLECTOMY AND ADENOIDECTOMY    . VAGINAL HYSTERECTOMY      There were no vitals filed for this visit.      Subjective Assessment - 12/28/15 0920    Subjective Patient reports no pain this morning. She states that the pain increases most when she stands and walks after sitting for extended periods of time.   Limitations Walking;Standing   Currently in Pain? No/denies   Pain Score 0-No pain   Pain Location Ankle   Pain Orientation Posterior;Distal;Right   Pain Descriptors / Indicators Sharp;Tightness   Pain Type Chronic pain   Pain Onset More than a month ago   Multiple Pain Sites No                                 PT Education - 12/28/15 0924    Education provided Yes   Education Details HEP on ankle 4 way exercise with tband. Dixie cup ice massage   Person(s) Educated Patient   Methods Explanation;Handout   Comprehension Verbalized understanding;Returned demonstration          PT Short Term Goals - 12/28/15 0933      PT SHORT TERM GOAL #1   Status Achieved           PT Long Term Goals - 12/28/15 0933      PT  LONG TERM GOAL #1   Status On-going     PT LONG TERM GOAL #2   Status Partially Met     PT LONG TERM GOAL #3   Status Partially Met  at best: 0/10, at worst: 6               Plan - 12/28/15 0925    Clinical Impression Statement Patient tolerated treatment well and was able to complete all exercises. Patient states that she mainly feels the discomfort/pain when she sits for an extended period of time and then stands up to walk. Patient has reported decreased symptoms since coming to therapy. Patient was given instructionon ice therapy for home that involves freezing water in dixie cups and rubbing the inflamed/swollen area on her heel.   Rehab Potential Good      Patient will benefit from skilled therapeutic intervention in order to improve the following deficits and impairments:  Abnormal gait, Decreased activity tolerance, Decreased balance, Decreased mobility, Decreased strength, Decreased range of motion, Difficulty walking, Impaired flexibility, Pain, Improper body mechanics  Visit Diagnosis: Pain in right ankle and joints of right foot  Difficulty in walking,  not elsewhere classified  Stiffness of right ankle, not elsewhere classified  Localized swelling, mass and lump, right lower limb     Problem List Patient Active Problem List   Diagnosis Date Noted  . Right thyroid nodule 09/06/2014  . Precordial chest pain 07/15/2014  . Abnormality of gait 10/26/2013  . Sinus tarsi syndrome of left ankle 09/09/2013    Toy Baker, SPT 12/28/2015, 9:39 AM  [address] [place], [place], [address] Phone: [phone]   Fax:  [phone]  Name: Teresa Lamb MRN: [id number] Date of Birth: 09/23/1950

## 2016-01-01 ENCOUNTER — Ambulatory Visit: Payer: 59 | Admitting: Physical Therapy

## 2016-01-01 DIAGNOSIS — R2241 Localized swelling, mass and lump, right lower limb: Secondary | ICD-10-CM

## 2016-01-01 DIAGNOSIS — M25571 Pain in right ankle and joints of right foot: Secondary | ICD-10-CM | POA: Diagnosis not present

## 2016-01-01 DIAGNOSIS — R262 Difficulty in walking, not elsewhere classified: Secondary | ICD-10-CM

## 2016-01-01 DIAGNOSIS — M25671 Stiffness of right ankle, not elsewhere classified: Secondary | ICD-10-CM

## 2016-01-01 NOTE — Therapy (Signed)
[address] [place], [place], [address] Phone: [phone]   Fax:  [phone]  Physical Therapy Treatment  Patient Details  Name: Teresa Lamb MRN: [id number] Date of Birth: 01/15/1951 Referring Provider: Clyde Canterbury  Encounter Date: 01/01/2016      PT End of Session - 01/01/16 1044    Visit Number 6   Date for PT Re-Evaluation 02/09/16   PT Start Time 1010   PT Stop Time 1100   PT Time Calculation (min) 50 min   Activity Tolerance Patient tolerated treatment well;No increased pain   Behavior During Therapy WFL for tasks assessed/performed      Past Medical History:  Diagnosis Date  . Hyperlipemia   . Insomnia   . Osteopenia   . Plantar fasciitis     Past Surgical History:  Procedure Laterality Date  . CHOLECYSTECTOMY    . TONSILLECTOMY AND ADENOIDECTOMY    . VAGINAL HYSTERECTOMY      There were no vitals filed for this visit.      Subjective Assessment - 01/01/16 1010    Subjective Patient states she is doing well and she is not having any pain today. She states that this weekend she felt the pain when "lugging the grandkids around".   Limitations Walking;Standing   How long can you walk comfortably? 30 minutes, shopping also bothers it   Patient Stated Goals have less pain   Currently in Pain? No/denies   Pain Score 0-No pain            [facility] PT Assessment - 01/01/16 0001      AROM   Right Ankle Dorsiflexion 8                     [facility] Adult PT Treatment/Exercise - 01/01/16 0001      High Level Balance   High Level Balance Activities Other (comment)  static stanindg, tandem standing on airex pads     Exercises   Exercises Knee/Hip     Knee/Hip Exercises: Machines for Strengthening   Cybex Knee Extension 5 lbs, 2 x10   Cybex Knee Flexion 20 lbs, 2x10     Modalities   Modalities Cryotherapy;Iontophoresis     Cryotherapy   Number Minutes  Cryotherapy 10 Minutes   Cryotherapy Location Ankle   Type of Cryotherapy Ice pack     Iontophoresis   Type of Iontophoresis Dexamethasone   Location right achilles   Time 4 hr patch     Manual Therapy   Manual Therapy Passive ROM     Ankle Exercises: Aerobic   Elliptical 6 minutes     Ankle Exercises: Standing   Heel Raises 20 reps  2 sets   Other Standing Ankle Exercises Forward lean DF assist on 6 inch step     Ankle Exercises: Stretches   Gastroc Stretch 30 seconds;3 reps                  PT Short Term Goals - 12/28/15 0933      PT SHORT TERM GOAL #1   Status Achieved           PT Long Term Goals - 12/28/15 0933      PT LONG TERM GOAL #1   Status On-going     PT LONG TERM GOAL #2   Status Partially Met     PT LONG TERM GOAL #3   Status Partially Met  at best: 0/10,  at worst: 6               Plan - 01/01/16 1045    Clinical Impression Statement Patient tolerated treatment well. She did not report any increased symptoms with the exercises. Patient was educated on knee curl and extension machine and the ellptical so that she feels more comfortable using them at the [facility]. Patient is able to do gastroc strengthening exercises without increased pain.   PT Frequency 2x / week   PT Duration 8 weeks   PT Treatment/Interventions Cryotherapy;Electrical Stimulation;Iontophoresis 86m/ml Dexamethasone;Functional mobility training;Stair training;Gait training;Ultrasound;Moist Heat;Therapeutic activities;Therapeutic exercise;Balance training;Neuromuscular re-education;Patient/family education;Manual techniques;Taping;Vasopneumatic Device   PT Next Visit Plan calf raises,leg strengthening, balance training   Consulted and Agree with Plan of Care Patient      Patient will benefit from skilled therapeutic intervention in order to improve the following deficits and impairments:  Abnormal gait, Decreased activity tolerance, Decreased balance, Decreased  mobility, Decreased strength, Decreased range of motion, Difficulty walking, Impaired flexibility, Pain, Improper body mechanics  Visit Diagnosis: Pain in right ankle and joints of right foot  Difficulty in walking, not elsewhere classified  Stiffness of right ankle, not elsewhere classified  Localized swelling, mass and lump, right lower limb     Problem List Patient Active Problem List   Diagnosis Date Noted  . Right thyroid nodule 09/06/2014  . Precordial chest pain 07/15/2014  . Abnormality of gait 10/26/2013  . Sinus tarsi syndrome of left ankle 09/09/2013    BToy Baker SPT 01/01/2016, 11:07 AM  [address] [place] [address]Phone: [phone]  Fax:  [phone] Name: Teresa GAYTONMRN: [id number] of Birth: [birth year]

## 2016-01-03 ENCOUNTER — Ambulatory Visit: Payer: 59 | Admitting: Physical Therapy

## 2016-01-03 ENCOUNTER — Encounter: Payer: Self-pay | Admitting: Physical Therapy

## 2016-01-03 DIAGNOSIS — R262 Difficulty in walking, not elsewhere classified: Secondary | ICD-10-CM

## 2016-01-03 DIAGNOSIS — M25671 Stiffness of right ankle, not elsewhere classified: Secondary | ICD-10-CM

## 2016-01-03 DIAGNOSIS — M25571 Pain in right ankle and joints of right foot: Secondary | ICD-10-CM

## 2016-01-03 DIAGNOSIS — R2241 Localized swelling, mass and lump, right lower limb: Secondary | ICD-10-CM

## 2016-01-03 NOTE — Therapy (Signed)
[address] [place], [place], [address] Phone: [phone]   Fax:  [phone]  Physical Therapy Treatment  Patient Details  Name: Teresa Lamb MRN: [id number] Date of Birth: 1950/04/06 Referring Provider: Clyde Canterbury  Encounter Date: 01/03/2016      PT End of Session - 01/03/16 1630    Visit Number 7   Date for PT Re-Evaluation 02/09/16   PT Start Time 0330   PT Stop Time 0420   PT Time Calculation (min) 50 min   Activity Tolerance Patient tolerated treatment well;No increased pain   Behavior During Therapy WFL for tasks assessed/performed      Past Medical History:  Diagnosis Date  . Hyperlipemia   . Insomnia   . Osteopenia   . Plantar fasciitis     Past Surgical History:  Procedure Laterality Date  . CHOLECYSTECTOMY    . TONSILLECTOMY AND ADENOIDECTOMY    . VAGINAL HYSTERECTOMY      There were no vitals filed for this visit.      Subjective Assessment - 01/03/16 1529    Subjective Patient reports she heped her daughter move some things aorund at her house yesterday and felt some pain in the heel then. She reports that during this time, she rated her pain 3/10. She states that she was bale to go to [employer] and the grocery store after the last visit with no increase in pain.   Limitations Walking;Standing   Patient Stated Goals have less pain   Currently in Pain? No/denies   Pain Location Ankle   Pain Orientation Posterior;Distal;Right   Pain Onset More than a month ago                         [facility] Adult PT Treatment/Exercise - 01/03/16 0001      Ambulation/Gait   Stairs Yes   Stair Management Technique No rails   Number of Stairs 14  3 sets   Height of Stairs 6     High Level Balance   High Level Balance Comments lunge on dynadisc 2x10, Airex static stand 2x60 secs, tandm stande 2x20 sec     Therapeutic Activites    Therapeutic Activities --     Knee/Hip Exercises: Aerobic   Elliptical 5 minutes lvl 3     Knee/Hip Exercises: Machines for Strengthening   Cybex Knee Extension 5 lbs, 2x10   Cybex Knee Flexion 20 lbs, 2x10     Modalities   Modalities Ultrasound     Ultrasound   Ultrasound Location right Achilles    Ultrasound Parameters 3.3 Mhz, 8 minutes   Ultrasound Goals Pain     Iontophoresis   Type of Iontophoresis Dexamethasone   Location right achilles   Time 4 hr patch     Ankle Exercises: Stretches   Gastroc Stretch 30 seconds;3 reps     Ankle Exercises: Standing   Heel Raises 20 reps  3 sets                PT Education - 01/03/16 1630    Education provided No          PT Short Term Goals - 12/28/15 0933      PT SHORT TERM GOAL #1   Status Achieved           PT Long Term Goals - 01/03/16 1633      PT LONG TERM GOAL #1   Status Achieved  PT LONG TERM GOAL #4   Status Achieved  Pt was able to go to grocery store for "45+" minutes without any pain"               Plan - 01/03/16 1631    Clinical Impression Statement Patient was able to navigate stairs (ascending and descending) with reciprocal pattern without any increased symptoms; a task that normally causes some pain. Ultrasound treatment was also attempted today and patient did not rrpeort any adverse effects. Patient states that she has noticed a difference and decrease in the "stiff" episodes that she normally feels after sitting for an extended period of time or when getting up in the morning. Continue to progress per pt tolerance.   Rehab Potential Good   PT Frequency 2x / week   PT Duration 8 weeks   PT Treatment/Interventions Cryotherapy;Electrical Stimulation;Iontophoresis 4mg /ml Dexamethasone;Functional mobility training;Stair training;Gait training;Ultrasound;Moist Heat;Therapeutic activities;Therapeutic exercise;Balance training;Neuromuscular re-education;Patient/family education;Manual  techniques;Taping;Vasopneumatic Device   PT Next Visit Plan calf raises,leg strengthening, balance training, [place] treatment   Consulted and Agree with Plan of Care Patient      Patient will benefit from skilled therapeutic intervention in order to improve the following deficits and impairments:  Abnormal gait, Decreased activity tolerance, Decreased balance, Decreased mobility, Decreased strength, Decreased range of motion, Difficulty walking, Impaired flexibility, Pain, Improper body mechanics  Visit Diagnosis: Pain in right ankle and joints of right foot  Difficulty in walking, not elsewhere classified  Stiffness of right ankle, not elsewhere classified  Localized swelling, mass and lump, right lower limb     Problem List Patient Active Problem List   Diagnosis Date Noted  . Right thyroid nodule 09/06/2014  . Precordial chest pain 07/15/2014  . Abnormality of gait 10/26/2013  . Sinus tarsi syndrome of left ankle 09/09/2013    Toy Baker, SPT 01/03/2016, 4:36 PM  [address] [place], [place], [address] Phone: [phone]   Fax:  [phone]  Name: Teresa Lamb MRN: [id number] Date of Birth: Jun 10, 1950

## 2016-01-08 ENCOUNTER — Ambulatory Visit: Payer: 59 | Admitting: Physical Therapy

## 2016-01-16 ENCOUNTER — Ambulatory Visit: Payer: 59 | Admitting: Physical Therapy

## 2016-04-11 ENCOUNTER — Other Ambulatory Visit: Payer: Self-pay | Admitting: Internal Medicine

## 2016-04-11 DIAGNOSIS — Z1231 Encounter for screening mammogram for malignant neoplasm of breast: Secondary | ICD-10-CM

## 2016-05-13 ENCOUNTER — Ambulatory Visit
Admission: RE | Admit: 2016-05-13 | Discharge: 2016-05-13 | Disposition: A | Discharge status: Home / Self Care | Payer: 59 | Source: Ambulatory Visit | Attending: Internal Medicine | Admitting: Internal Medicine

## 2016-05-13 DIAGNOSIS — Z1231 Encounter for screening mammogram for malignant neoplasm of breast: Secondary | ICD-10-CM

## 2017-04-15 ENCOUNTER — Other Ambulatory Visit: Payer: Self-pay | Admitting: Internal Medicine

## 2017-04-15 DIAGNOSIS — Z1231 Encounter for screening mammogram for malignant neoplasm of breast: Secondary | ICD-10-CM

## 2017-05-14 ENCOUNTER — Ambulatory Visit
Admission: RE | Admit: 2017-05-14 | Discharge: 2017-05-14 | Disposition: A | Discharge status: Home / Self Care | Payer: 59 | Source: Ambulatory Visit | Attending: Internal Medicine | Admitting: Internal Medicine

## 2017-05-14 DIAGNOSIS — Z1231 Encounter for screening mammogram for malignant neoplasm of breast: Secondary | ICD-10-CM

## 2017-07-09 DIAGNOSIS — M25579 Pain in unspecified ankle and joints of unspecified foot: Secondary | ICD-10-CM | POA: Diagnosis not present

## 2017-07-09 DIAGNOSIS — M25572 Pain in left ankle and joints of left foot: Secondary | ICD-10-CM | POA: Diagnosis not present

## 2017-07-09 DIAGNOSIS — M79672 Pain in left foot: Secondary | ICD-10-CM | POA: Diagnosis not present

## 2017-07-16 DIAGNOSIS — M25572 Pain in left ankle and joints of left foot: Secondary | ICD-10-CM | POA: Diagnosis not present

## 2017-07-18 DIAGNOSIS — H2513 Age-related nuclear cataract, bilateral: Secondary | ICD-10-CM | POA: Diagnosis not present

## 2017-07-18 DIAGNOSIS — H524 Presbyopia: Secondary | ICD-10-CM | POA: Diagnosis not present

## 2017-07-28 DIAGNOSIS — M19072 Primary osteoarthritis, left ankle and foot: Secondary | ICD-10-CM | POA: Diagnosis not present

## 2017-08-25 DIAGNOSIS — Z6830 Body mass index (BMI) 30.0-30.9, adult: Secondary | ICD-10-CM | POA: Diagnosis not present

## 2017-08-25 DIAGNOSIS — R05 Cough: Secondary | ICD-10-CM | POA: Diagnosis not present

## 2017-08-25 DIAGNOSIS — J069 Acute upper respiratory infection, unspecified: Secondary | ICD-10-CM | POA: Diagnosis not present

## 2017-08-25 DIAGNOSIS — H6123 Impacted cerumen, bilateral: Secondary | ICD-10-CM | POA: Diagnosis not present

## 2017-11-19 DIAGNOSIS — J019 Acute sinusitis, unspecified: Secondary | ICD-10-CM | POA: Diagnosis not present

## 2017-11-19 DIAGNOSIS — Z6830 Body mass index (BMI) 30.0-30.9, adult: Secondary | ICD-10-CM | POA: Diagnosis not present

## 2017-11-19 DIAGNOSIS — J040 Acute laryngitis: Secondary | ICD-10-CM | POA: Diagnosis not present

## 2017-12-26 DIAGNOSIS — E041 Nontoxic single thyroid nodule: Secondary | ICD-10-CM | POA: Diagnosis not present

## 2017-12-26 DIAGNOSIS — M859 Disorder of bone density and structure, unspecified: Secondary | ICD-10-CM | POA: Diagnosis not present

## 2017-12-26 DIAGNOSIS — E7849 Other hyperlipidemia: Secondary | ICD-10-CM | POA: Diagnosis not present

## 2017-12-26 DIAGNOSIS — R82998 Other abnormal findings in urine: Secondary | ICD-10-CM | POA: Diagnosis not present

## 2018-01-02 DIAGNOSIS — Z1389 Encounter for screening for other disorder: Secondary | ICD-10-CM | POA: Diagnosis not present

## 2018-01-02 DIAGNOSIS — Z6829 Body mass index (BMI) 29.0-29.9, adult: Secondary | ICD-10-CM | POA: Diagnosis not present

## 2018-01-02 DIAGNOSIS — E041 Nontoxic single thyroid nodule: Secondary | ICD-10-CM | POA: Diagnosis not present

## 2018-01-02 DIAGNOSIS — H6123 Impacted cerumen, bilateral: Secondary | ICD-10-CM | POA: Diagnosis not present

## 2018-01-02 DIAGNOSIS — G608 Other hereditary and idiopathic neuropathies: Secondary | ICD-10-CM | POA: Diagnosis not present

## 2018-01-02 DIAGNOSIS — E7849 Other hyperlipidemia: Secondary | ICD-10-CM | POA: Diagnosis not present

## 2018-01-02 DIAGNOSIS — Z0000 Encounter for general adult medical examination without abnormal findings: Secondary | ICD-10-CM | POA: Diagnosis not present

## 2018-01-02 DIAGNOSIS — Z1231 Encounter for screening mammogram for malignant neoplasm of breast: Secondary | ICD-10-CM | POA: Diagnosis not present

## 2018-01-02 DIAGNOSIS — M859 Disorder of bone density and structure, unspecified: Secondary | ICD-10-CM | POA: Diagnosis not present

## 2018-01-02 DIAGNOSIS — M79671 Pain in right foot: Secondary | ICD-10-CM | POA: Diagnosis not present

## 2018-01-02 DIAGNOSIS — J069 Acute upper respiratory infection, unspecified: Secondary | ICD-10-CM | POA: Diagnosis not present

## 2018-01-02 DIAGNOSIS — K219 Gastro-esophageal reflux disease without esophagitis: Secondary | ICD-10-CM | POA: Diagnosis not present

## 2018-01-06 ENCOUNTER — Other Ambulatory Visit: Payer: Self-pay | Admitting: Internal Medicine

## 2018-01-06 DIAGNOSIS — E785 Hyperlipidemia, unspecified: Secondary | ICD-10-CM

## 2018-01-06 DIAGNOSIS — Z1212 Encounter for screening for malignant neoplasm of rectum: Secondary | ICD-10-CM | POA: Diagnosis not present

## 2018-01-19 ENCOUNTER — Ambulatory Visit
Admission: RE | Admit: 2018-01-19 | Discharge: 2018-01-19 | Disposition: A | Discharge status: Home / Self Care | Payer: Medicare Other | Source: Ambulatory Visit | Attending: Internal Medicine | Admitting: Internal Medicine

## 2018-01-19 DIAGNOSIS — I2510 Atherosclerotic heart disease of native coronary artery without angina pectoris: Secondary | ICD-10-CM | POA: Diagnosis not present

## 2018-01-19 DIAGNOSIS — E785 Hyperlipidemia, unspecified: Secondary | ICD-10-CM | POA: Diagnosis not present

## 2018-01-20 ENCOUNTER — Other Ambulatory Visit: Payer: Self-pay | Admitting: Internal Medicine

## 2018-01-20 DIAGNOSIS — E041 Nontoxic single thyroid nodule: Secondary | ICD-10-CM

## 2018-02-02 DIAGNOSIS — H2513 Age-related nuclear cataract, bilateral: Secondary | ICD-10-CM | POA: Diagnosis not present

## 2018-02-02 DIAGNOSIS — H04123 Dry eye syndrome of bilateral lacrimal glands: Secondary | ICD-10-CM | POA: Diagnosis not present

## 2018-02-23 DIAGNOSIS — H2513 Age-related nuclear cataract, bilateral: Secondary | ICD-10-CM | POA: Diagnosis not present

## 2018-02-27 ENCOUNTER — Other Ambulatory Visit: Payer: Medicare Other

## 2018-03-02 ENCOUNTER — Ambulatory Visit
Admission: RE | Admit: 2018-03-02 | Discharge: 2018-03-02 | Disposition: A | Discharge status: Home / Self Care | Payer: Medicare Other | Source: Ambulatory Visit | Attending: Internal Medicine | Admitting: Internal Medicine

## 2018-03-02 DIAGNOSIS — E041 Nontoxic single thyroid nodule: Secondary | ICD-10-CM

## 2018-03-02 DIAGNOSIS — E042 Nontoxic multinodular goiter: Secondary | ICD-10-CM | POA: Diagnosis not present

## 2018-03-04 DIAGNOSIS — Z6828 Body mass index (BMI) 28.0-28.9, adult: Secondary | ICD-10-CM | POA: Diagnosis not present

## 2018-03-04 DIAGNOSIS — N3281 Overactive bladder: Secondary | ICD-10-CM | POA: Diagnosis not present

## 2018-03-04 DIAGNOSIS — M8580 Other specified disorders of bone density and structure, unspecified site: Secondary | ICD-10-CM | POA: Diagnosis not present

## 2018-03-04 DIAGNOSIS — Z1239 Encounter for other screening for malignant neoplasm of breast: Secondary | ICD-10-CM | POA: Diagnosis not present

## 2018-03-04 DIAGNOSIS — R32 Unspecified urinary incontinence: Secondary | ICD-10-CM | POA: Diagnosis not present

## 2018-03-04 DIAGNOSIS — N952 Postmenopausal atrophic vaginitis: Secondary | ICD-10-CM | POA: Diagnosis not present

## 2018-03-04 DIAGNOSIS — N819 Female genital prolapse, unspecified: Secondary | ICD-10-CM | POA: Diagnosis not present

## 2018-03-04 DIAGNOSIS — E559 Vitamin D deficiency, unspecified: Secondary | ICD-10-CM | POA: Diagnosis not present

## 2018-03-04 DIAGNOSIS — N6310 Unspecified lump in the right breast, unspecified quadrant: Secondary | ICD-10-CM | POA: Diagnosis not present

## 2018-03-05 ENCOUNTER — Other Ambulatory Visit: Payer: Self-pay | Admitting: Obstetrics and Gynecology

## 2018-03-05 DIAGNOSIS — N6310 Unspecified lump in the right breast, unspecified quadrant: Secondary | ICD-10-CM

## 2018-03-06 ENCOUNTER — Ambulatory Visit
Admission: RE | Admit: 2018-03-06 | Discharge: 2018-03-06 | Disposition: A | Discharge status: Home / Self Care | Payer: Medicare Other | Source: Ambulatory Visit | Attending: Obstetrics and Gynecology | Admitting: Obstetrics and Gynecology

## 2018-03-06 DIAGNOSIS — N6489 Other specified disorders of breast: Secondary | ICD-10-CM | POA: Diagnosis not present

## 2018-03-06 DIAGNOSIS — R928 Other abnormal and inconclusive findings on diagnostic imaging of breast: Secondary | ICD-10-CM | POA: Diagnosis not present

## 2018-03-06 DIAGNOSIS — N6310 Unspecified lump in the right breast, unspecified quadrant: Secondary | ICD-10-CM

## 2018-07-02 ENCOUNTER — Other Ambulatory Visit: Payer: Self-pay | Admitting: Obstetrics and Gynecology

## 2018-07-02 DIAGNOSIS — Z1231 Encounter for screening mammogram for malignant neoplasm of breast: Secondary | ICD-10-CM

## 2018-07-23 DIAGNOSIS — H25813 Combined forms of age-related cataract, bilateral: Secondary | ICD-10-CM | POA: Diagnosis not present

## 2018-08-07 DIAGNOSIS — Z01818 Encounter for other preprocedural examination: Secondary | ICD-10-CM | POA: Diagnosis not present

## 2018-08-07 DIAGNOSIS — Z1159 Encounter for screening for other viral diseases: Secondary | ICD-10-CM | POA: Diagnosis not present

## 2018-08-10 DIAGNOSIS — H25811 Combined forms of age-related cataract, right eye: Secondary | ICD-10-CM | POA: Diagnosis not present

## 2018-08-10 DIAGNOSIS — H52221 Regular astigmatism, right eye: Secondary | ICD-10-CM | POA: Diagnosis not present

## 2018-08-10 DIAGNOSIS — Z6829 Body mass index (BMI) 29.0-29.9, adult: Secondary | ICD-10-CM | POA: Diagnosis not present

## 2018-08-10 DIAGNOSIS — E669 Obesity, unspecified: Secondary | ICD-10-CM | POA: Diagnosis not present

## 2018-08-21 DIAGNOSIS — Z1159 Encounter for screening for other viral diseases: Secondary | ICD-10-CM | POA: Diagnosis not present

## 2018-08-21 DIAGNOSIS — Z01818 Encounter for other preprocedural examination: Secondary | ICD-10-CM | POA: Diagnosis not present

## 2018-08-24 ENCOUNTER — Ambulatory Visit: Payer: Medicare Other

## 2018-08-24 DIAGNOSIS — Z961 Presence of intraocular lens: Secondary | ICD-10-CM | POA: Diagnosis not present

## 2018-08-24 DIAGNOSIS — Z79899 Other long term (current) drug therapy: Secondary | ICD-10-CM | POA: Diagnosis not present

## 2018-08-24 DIAGNOSIS — Z90710 Acquired absence of both cervix and uterus: Secondary | ICD-10-CM | POA: Diagnosis not present

## 2018-08-24 DIAGNOSIS — Z6829 Body mass index (BMI) 29.0-29.9, adult: Secondary | ICD-10-CM | POA: Diagnosis not present

## 2018-08-24 DIAGNOSIS — Z880 Allergy status to penicillin: Secondary | ICD-10-CM | POA: Diagnosis not present

## 2018-08-24 DIAGNOSIS — E669 Obesity, unspecified: Secondary | ICD-10-CM | POA: Diagnosis not present

## 2018-08-24 DIAGNOSIS — H52222 Regular astigmatism, left eye: Secondary | ICD-10-CM | POA: Diagnosis not present

## 2018-08-24 DIAGNOSIS — Z9841 Cataract extraction status, right eye: Secondary | ICD-10-CM | POA: Diagnosis not present

## 2018-08-24 DIAGNOSIS — H25812 Combined forms of age-related cataract, left eye: Secondary | ICD-10-CM | POA: Diagnosis not present

## 2018-09-04 DIAGNOSIS — L304 Erythema intertrigo: Secondary | ICD-10-CM | POA: Diagnosis not present

## 2018-09-04 DIAGNOSIS — L84 Corns and callosities: Secondary | ICD-10-CM | POA: Diagnosis not present

## 2018-09-09 ENCOUNTER — Ambulatory Visit
Admission: RE | Admit: 2018-09-09 | Discharge: 2018-09-09 | Disposition: A | Discharge status: Home / Self Care | Payer: Medicare Other | Source: Ambulatory Visit | Attending: Obstetrics and Gynecology | Admitting: Obstetrics and Gynecology

## 2018-09-09 ENCOUNTER — Other Ambulatory Visit: Payer: Self-pay

## 2018-09-09 DIAGNOSIS — Z1231 Encounter for screening mammogram for malignant neoplasm of breast: Secondary | ICD-10-CM

## 2018-09-25 DIAGNOSIS — Z9842 Cataract extraction status, left eye: Secondary | ICD-10-CM | POA: Diagnosis not present

## 2018-09-25 DIAGNOSIS — Z961 Presence of intraocular lens: Secondary | ICD-10-CM | POA: Diagnosis not present

## 2018-12-07 DIAGNOSIS — Z23 Encounter for immunization: Secondary | ICD-10-CM | POA: Diagnosis not present

## 2019-02-26 DIAGNOSIS — M8580 Other specified disorders of bone density and structure, unspecified site: Secondary | ICD-10-CM | POA: Diagnosis not present

## 2019-02-26 DIAGNOSIS — E785 Hyperlipidemia, unspecified: Secondary | ICD-10-CM | POA: Diagnosis not present

## 2019-02-26 DIAGNOSIS — M79671 Pain in right foot: Secondary | ICD-10-CM | POA: Diagnosis not present

## 2019-02-26 DIAGNOSIS — K219 Gastro-esophageal reflux disease without esophagitis: Secondary | ICD-10-CM | POA: Diagnosis not present

## 2019-02-26 DIAGNOSIS — Z0000 Encounter for general adult medical examination without abnormal findings: Secondary | ICD-10-CM | POA: Diagnosis not present

## 2019-02-26 DIAGNOSIS — M79673 Pain in unspecified foot: Secondary | ICD-10-CM | POA: Diagnosis not present

## 2019-02-26 DIAGNOSIS — Z1331 Encounter for screening for depression: Secondary | ICD-10-CM | POA: Diagnosis not present

## 2019-02-26 DIAGNOSIS — R05 Cough: Secondary | ICD-10-CM | POA: Diagnosis not present

## 2019-03-01 ENCOUNTER — Other Ambulatory Visit: Payer: Self-pay | Admitting: Internal Medicine

## 2019-03-01 DIAGNOSIS — E041 Nontoxic single thyroid nodule: Secondary | ICD-10-CM

## 2019-03-01 DIAGNOSIS — Z1212 Encounter for screening for malignant neoplasm of rectum: Secondary | ICD-10-CM | POA: Diagnosis not present

## 2019-03-28 ENCOUNTER — Ambulatory Visit: Payer: Medicare Other | Attending: Internal Medicine

## 2019-03-28 DIAGNOSIS — Z23 Encounter for immunization: Secondary | ICD-10-CM | POA: Insufficient documentation

## 2019-03-28 NOTE — Progress Notes (Signed)
   Covid-19 Vaccination Clinic  Name:  Teresa Lamb    MRN: [id number] DOB: 03-Apr-1950  03/28/2019  Teresa Lamb was observed post Covid-19 immunization for 15 minutes without incidence. She was provided with Vaccine Information Sheet and instruction to access the V-Safe system.   Teresa Lamb was instructed to call 911 with any severe reactions post vaccine: Marland Kitchen Difficulty breathing  . Swelling of your face and throat  . A fast heartbeat  . A bad rash all over your body  . Dizziness and weakness    Immunizations Administered    Name Date Dose VIS Date Route   Pfizer COVID-19 Vaccine 03/28/2019  3:04 PM 0.3 mL 01/29/2019 Intramuscular   Manufacturer: [place]   Lot: [id number]   [place]: [id number]

## 2019-04-13 ENCOUNTER — Ambulatory Visit: Payer: Medicare Other

## 2019-04-21 ENCOUNTER — Ambulatory Visit: Payer: Medicare Other

## 2019-04-22 ENCOUNTER — Ambulatory Visit: Payer: Medicare Other | Attending: Internal Medicine

## 2019-04-22 DIAGNOSIS — Z23 Encounter for immunization: Secondary | ICD-10-CM | POA: Insufficient documentation

## 2019-04-22 NOTE — Progress Notes (Signed)
   Covid-19 Vaccination Clinic  Name:  Teresa Lamb    MRN: [id number] DOB: Apr 26, 1950  04/22/2019  [name] was observed post Covid-19 immunization for 15 minutes without incident. She was provided with Vaccine Information Sheet and instruction to access the V-Safe system.   [name] was instructed to call 911 with any severe reactions post vaccine: Marland Kitchen Difficulty breathing  . Swelling of face and throat  . A fast heartbeat  . A bad rash all over body  . Dizziness and weakness   Immunizations Administered    Name Date Dose VIS Date Route   Pfizer COVID-19 Vaccine 04/22/2019  5:43 PM 0.3 mL 01/29/2019 Intramuscular   Manufacturer: [place]   Lot: [id number]   [place]: [id number]

## 2019-07-30 DIAGNOSIS — L304 Erythema intertrigo: Secondary | ICD-10-CM | POA: Diagnosis not present

## 2019-07-30 DIAGNOSIS — D225 Melanocytic nevi of trunk: Secondary | ICD-10-CM | POA: Diagnosis not present

## 2019-08-04 ENCOUNTER — Other Ambulatory Visit: Payer: Self-pay | Admitting: Obstetrics and Gynecology

## 2019-08-04 DIAGNOSIS — Z1231 Encounter for screening mammogram for malignant neoplasm of breast: Secondary | ICD-10-CM

## 2019-09-22 ENCOUNTER — Ambulatory Visit: Payer: Medicare Other

## 2019-09-29 ENCOUNTER — Other Ambulatory Visit: Payer: Self-pay

## 2019-09-29 ENCOUNTER — Ambulatory Visit
Admission: RE | Admit: 2019-09-29 | Discharge: 2019-09-29 | Disposition: A | Discharge status: Home / Self Care | Payer: Medicare Other | Source: Ambulatory Visit | Attending: Obstetrics and Gynecology | Admitting: Obstetrics and Gynecology

## 2019-09-29 DIAGNOSIS — Z1231 Encounter for screening mammogram for malignant neoplasm of breast: Secondary | ICD-10-CM | POA: Diagnosis not present

## 2020-03-03 DIAGNOSIS — E785 Hyperlipidemia, unspecified: Secondary | ICD-10-CM | POA: Diagnosis not present

## 2020-03-03 DIAGNOSIS — E041 Nontoxic single thyroid nodule: Secondary | ICD-10-CM | POA: Diagnosis not present

## 2020-05-11 ENCOUNTER — Other Ambulatory Visit: Payer: Self-pay | Admitting: Internal Medicine

## 2020-05-11 DIAGNOSIS — H6123 Impacted cerumen, bilateral: Secondary | ICD-10-CM | POA: Diagnosis not present

## 2020-05-11 DIAGNOSIS — Z23 Encounter for immunization: Secondary | ICD-10-CM | POA: Diagnosis not present

## 2020-05-11 DIAGNOSIS — E041 Nontoxic single thyroid nodule: Secondary | ICD-10-CM

## 2020-05-11 DIAGNOSIS — Z1331 Encounter for screening for depression: Secondary | ICD-10-CM | POA: Diagnosis not present

## 2020-05-11 DIAGNOSIS — Z0000 Encounter for general adult medical examination without abnormal findings: Secondary | ICD-10-CM | POA: Diagnosis not present

## 2020-05-11 DIAGNOSIS — M79671 Pain in right foot: Secondary | ICD-10-CM | POA: Diagnosis not present

## 2020-05-11 DIAGNOSIS — M8580 Other specified disorders of bone density and structure, unspecified site: Secondary | ICD-10-CM | POA: Diagnosis not present

## 2020-05-11 DIAGNOSIS — E785 Hyperlipidemia, unspecified: Secondary | ICD-10-CM | POA: Diagnosis not present

## 2020-05-30 ENCOUNTER — Ambulatory Visit
Admission: RE | Admit: 2020-05-30 | Discharge: 2020-05-30 | Disposition: A | Discharge status: Home / Self Care | Payer: Medicare Other | Source: Ambulatory Visit | Attending: Internal Medicine | Admitting: Internal Medicine

## 2020-05-30 DIAGNOSIS — E041 Nontoxic single thyroid nodule: Secondary | ICD-10-CM | POA: Diagnosis not present

## 2020-06-01 DIAGNOSIS — R82998 Other abnormal findings in urine: Secondary | ICD-10-CM | POA: Diagnosis not present

## 2020-06-01 DIAGNOSIS — Z1212 Encounter for screening for malignant neoplasm of rectum: Secondary | ICD-10-CM | POA: Diagnosis not present

## 2020-07-19 DIAGNOSIS — M659 Synovitis and tenosynovitis, unspecified: Secondary | ICD-10-CM | POA: Diagnosis not present

## 2020-07-19 DIAGNOSIS — M19072 Primary osteoarthritis, left ankle and foot: Secondary | ICD-10-CM | POA: Diagnosis not present

## 2020-07-27 DIAGNOSIS — H0015 Chalazion left lower eyelid: Secondary | ICD-10-CM | POA: Diagnosis not present

## 2020-08-29 ENCOUNTER — Other Ambulatory Visit: Payer: Self-pay | Admitting: Obstetrics and Gynecology

## 2020-08-29 DIAGNOSIS — Z1231 Encounter for screening mammogram for malignant neoplasm of breast: Secondary | ICD-10-CM

## 2020-10-24 ENCOUNTER — Other Ambulatory Visit: Payer: Self-pay

## 2020-10-24 ENCOUNTER — Ambulatory Visit
Admission: RE | Admit: 2020-10-24 | Discharge: 2020-10-24 | Disposition: A | Discharge status: Home / Self Care | Payer: Medicare Other | Source: Ambulatory Visit | Attending: Obstetrics and Gynecology | Admitting: Obstetrics and Gynecology

## 2020-10-24 DIAGNOSIS — Z1231 Encounter for screening mammogram for malignant neoplasm of breast: Secondary | ICD-10-CM | POA: Diagnosis not present

## 2020-12-12 DIAGNOSIS — H524 Presbyopia: Secondary | ICD-10-CM | POA: Diagnosis not present

## 2020-12-12 DIAGNOSIS — Z961 Presence of intraocular lens: Secondary | ICD-10-CM | POA: Diagnosis not present

## 2020-12-28 DIAGNOSIS — Z23 Encounter for immunization: Secondary | ICD-10-CM | POA: Diagnosis not present

## 2021-06-21 DIAGNOSIS — E041 Nontoxic single thyroid nodule: Secondary | ICD-10-CM | POA: Diagnosis not present

## 2021-06-21 DIAGNOSIS — E785 Hyperlipidemia, unspecified: Secondary | ICD-10-CM | POA: Diagnosis not present

## 2021-06-29 DIAGNOSIS — Z1331 Encounter for screening for depression: Secondary | ICD-10-CM | POA: Diagnosis not present

## 2021-06-29 DIAGNOSIS — E041 Nontoxic single thyroid nodule: Secondary | ICD-10-CM | POA: Diagnosis not present

## 2021-06-29 DIAGNOSIS — E785 Hyperlipidemia, unspecified: Secondary | ICD-10-CM | POA: Diagnosis not present

## 2021-06-29 DIAGNOSIS — R82998 Other abnormal findings in urine: Secondary | ICD-10-CM | POA: Diagnosis not present

## 2021-06-29 DIAGNOSIS — K219 Gastro-esophageal reflux disease without esophagitis: Secondary | ICD-10-CM | POA: Diagnosis not present

## 2021-06-29 DIAGNOSIS — H6123 Impacted cerumen, bilateral: Secondary | ICD-10-CM | POA: Diagnosis not present

## 2021-06-29 DIAGNOSIS — Z0000 Encounter for general adult medical examination without abnormal findings: Secondary | ICD-10-CM | POA: Diagnosis not present

## 2021-06-29 DIAGNOSIS — Z23 Encounter for immunization: Secondary | ICD-10-CM | POA: Diagnosis not present

## 2021-06-29 DIAGNOSIS — Z1389 Encounter for screening for other disorder: Secondary | ICD-10-CM | POA: Diagnosis not present

## 2021-06-29 DIAGNOSIS — R7301 Impaired fasting glucose: Secondary | ICD-10-CM | POA: Diagnosis not present

## 2021-06-29 DIAGNOSIS — M8580 Other specified disorders of bone density and structure, unspecified site: Secondary | ICD-10-CM | POA: Diagnosis not present

## 2021-07-02 ENCOUNTER — Other Ambulatory Visit: Payer: Self-pay | Admitting: Internal Medicine

## 2021-07-02 DIAGNOSIS — E041 Nontoxic single thyroid nodule: Secondary | ICD-10-CM

## 2021-07-12 ENCOUNTER — Ambulatory Visit
Admission: RE | Admit: 2021-07-12 | Discharge: 2021-07-12 | Disposition: A | Discharge status: Home / Self Care | Payer: Medicare Other | Source: Ambulatory Visit | Attending: Internal Medicine | Admitting: Internal Medicine

## 2021-07-12 DIAGNOSIS — E041 Nontoxic single thyroid nodule: Secondary | ICD-10-CM

## 2021-07-12 DIAGNOSIS — E042 Nontoxic multinodular goiter: Secondary | ICD-10-CM | POA: Diagnosis not present

## 2021-08-16 DIAGNOSIS — M25551 Pain in right hip: Secondary | ICD-10-CM | POA: Diagnosis not present

## 2021-09-14 DIAGNOSIS — H6123 Impacted cerumen, bilateral: Secondary | ICD-10-CM | POA: Diagnosis not present

## 2021-09-19 ENCOUNTER — Other Ambulatory Visit: Payer: Self-pay | Admitting: Obstetrics and Gynecology

## 2021-09-19 DIAGNOSIS — Z1231 Encounter for screening mammogram for malignant neoplasm of breast: Secondary | ICD-10-CM

## 2021-10-25 ENCOUNTER — Ambulatory Visit: Payer: Medicare Other

## 2021-11-12 ENCOUNTER — Ambulatory Visit: Payer: Medicare Other

## 2021-12-10 ENCOUNTER — Ambulatory Visit
Admission: RE | Admit: 2021-12-10 | Discharge: 2021-12-10 | Disposition: A | Discharge status: Home / Self Care | Payer: Medicare Other | Source: Ambulatory Visit | Attending: Obstetrics and Gynecology | Admitting: Obstetrics and Gynecology

## 2021-12-10 DIAGNOSIS — Z1231 Encounter for screening mammogram for malignant neoplasm of breast: Secondary | ICD-10-CM

## 2021-12-18 DIAGNOSIS — H26493 Other secondary cataract, bilateral: Secondary | ICD-10-CM | POA: Diagnosis not present

## 2021-12-18 DIAGNOSIS — H52203 Unspecified astigmatism, bilateral: Secondary | ICD-10-CM | POA: Diagnosis not present

## 2021-12-31 DIAGNOSIS — H26491 Other secondary cataract, right eye: Secondary | ICD-10-CM | POA: Diagnosis not present

## 2022-05-23 DIAGNOSIS — H26492 Other secondary cataract, left eye: Secondary | ICD-10-CM | POA: Diagnosis not present

## 2022-05-27 DIAGNOSIS — H26492 Other secondary cataract, left eye: Secondary | ICD-10-CM | POA: Diagnosis not present

## 2022-07-31 DIAGNOSIS — E785 Hyperlipidemia, unspecified: Secondary | ICD-10-CM | POA: Diagnosis not present

## 2022-07-31 DIAGNOSIS — E041 Nontoxic single thyroid nodule: Secondary | ICD-10-CM | POA: Diagnosis not present

## 2022-07-31 DIAGNOSIS — K219 Gastro-esophageal reflux disease without esophagitis: Secondary | ICD-10-CM | POA: Diagnosis not present

## 2022-07-31 DIAGNOSIS — Z1212 Encounter for screening for malignant neoplasm of rectum: Secondary | ICD-10-CM | POA: Diagnosis not present

## 2022-08-07 DIAGNOSIS — E785 Hyperlipidemia, unspecified: Secondary | ICD-10-CM | POA: Diagnosis not present

## 2022-08-07 DIAGNOSIS — H6123 Impacted cerumen, bilateral: Secondary | ICD-10-CM | POA: Diagnosis not present

## 2022-08-07 DIAGNOSIS — Z1331 Encounter for screening for depression: Secondary | ICD-10-CM | POA: Diagnosis not present

## 2022-08-07 DIAGNOSIS — Z0000 Encounter for general adult medical examination without abnormal findings: Secondary | ICD-10-CM | POA: Diagnosis not present

## 2022-08-07 DIAGNOSIS — R32 Unspecified urinary incontinence: Secondary | ICD-10-CM | POA: Diagnosis not present

## 2022-08-07 DIAGNOSIS — M8580 Other specified disorders of bone density and structure, unspecified site: Secondary | ICD-10-CM | POA: Diagnosis not present

## 2022-08-07 DIAGNOSIS — K219 Gastro-esophageal reflux disease without esophagitis: Secondary | ICD-10-CM | POA: Diagnosis not present

## 2022-08-07 DIAGNOSIS — R7301 Impaired fasting glucose: Secondary | ICD-10-CM | POA: Diagnosis not present

## 2022-08-07 DIAGNOSIS — E041 Nontoxic single thyroid nodule: Secondary | ICD-10-CM | POA: Diagnosis not present

## 2022-09-16 DIAGNOSIS — Z1211 Encounter for screening for malignant neoplasm of colon: Secondary | ICD-10-CM | POA: Diagnosis not present

## 2022-10-31 DIAGNOSIS — M25561 Pain in right knee: Secondary | ICD-10-CM | POA: Diagnosis not present

## 2022-10-31 DIAGNOSIS — M25551 Pain in right hip: Secondary | ICD-10-CM | POA: Diagnosis not present

## 2022-10-31 DIAGNOSIS — M1711 Unilateral primary osteoarthritis, right knee: Secondary | ICD-10-CM | POA: Diagnosis not present

## 2022-10-31 DIAGNOSIS — M25562 Pain in left knee: Secondary | ICD-10-CM | POA: Diagnosis not present

## 2022-11-05 ENCOUNTER — Ambulatory Visit (INDEPENDENT_AMBULATORY_CARE_PROVIDER_SITE_OTHER): Payer: PPO

## 2022-11-05 ENCOUNTER — Other Ambulatory Visit (HOSPITAL_BASED_OUTPATIENT_CLINIC_OR_DEPARTMENT_OTHER): Payer: Self-pay | Admitting: Orthopaedic Surgery

## 2022-11-05 DIAGNOSIS — M79662 Pain in left lower leg: Secondary | ICD-10-CM

## 2022-11-05 DIAGNOSIS — M7661 Achilles tendinitis, right leg: Secondary | ICD-10-CM | POA: Diagnosis not present

## 2022-11-05 DIAGNOSIS — M79661 Pain in right lower leg: Secondary | ICD-10-CM

## 2022-11-05 DIAGNOSIS — M79671 Pain in right foot: Secondary | ICD-10-CM | POA: Diagnosis not present

## 2022-11-05 DIAGNOSIS — M7671 Peroneal tendinitis, right leg: Secondary | ICD-10-CM | POA: Diagnosis not present

## 2022-11-05 DIAGNOSIS — S93491A Sprain of other ligament of right ankle, initial encounter: Secondary | ICD-10-CM | POA: Diagnosis not present

## 2022-11-11 ENCOUNTER — Other Ambulatory Visit: Payer: Self-pay | Admitting: Internal Medicine

## 2022-11-11 DIAGNOSIS — Z1231 Encounter for screening mammogram for malignant neoplasm of breast: Secondary | ICD-10-CM

## 2022-11-14 DIAGNOSIS — M1712 Unilateral primary osteoarthritis, left knee: Secondary | ICD-10-CM | POA: Diagnosis not present

## 2022-12-03 DIAGNOSIS — M7661 Achilles tendinitis, right leg: Secondary | ICD-10-CM | POA: Diagnosis not present

## 2022-12-03 DIAGNOSIS — M7671 Peroneal tendinitis, right leg: Secondary | ICD-10-CM | POA: Diagnosis not present

## 2022-12-03 DIAGNOSIS — M25562 Pain in left knee: Secondary | ICD-10-CM | POA: Diagnosis not present

## 2022-12-03 DIAGNOSIS — S93491A Sprain of other ligament of right ankle, initial encounter: Secondary | ICD-10-CM | POA: Diagnosis not present

## 2022-12-05 DIAGNOSIS — S83242A Other tear of medial meniscus, current injury, left knee, initial encounter: Secondary | ICD-10-CM | POA: Diagnosis not present

## 2022-12-24 ENCOUNTER — Ambulatory Visit: Payer: PPO

## 2022-12-24 DIAGNOSIS — M2242 Chondromalacia patellae, left knee: Secondary | ICD-10-CM | POA: Diagnosis not present

## 2022-12-24 DIAGNOSIS — M23322 Other meniscus derangements, posterior horn of medial meniscus, left knee: Secondary | ICD-10-CM | POA: Diagnosis not present

## 2022-12-24 DIAGNOSIS — M23222 Derangement of posterior horn of medial meniscus due to old tear or injury, left knee: Secondary | ICD-10-CM | POA: Diagnosis not present

## 2022-12-24 DIAGNOSIS — G8918 Other acute postprocedural pain: Secondary | ICD-10-CM | POA: Diagnosis not present

## 2023-01-13 DIAGNOSIS — Z961 Presence of intraocular lens: Secondary | ICD-10-CM | POA: Diagnosis not present

## 2023-01-13 DIAGNOSIS — H43813 Vitreous degeneration, bilateral: Secondary | ICD-10-CM | POA: Diagnosis not present

## 2023-01-13 DIAGNOSIS — H524 Presbyopia: Secondary | ICD-10-CM | POA: Diagnosis not present

## 2023-01-21 DIAGNOSIS — Z5189 Encounter for other specified aftercare: Secondary | ICD-10-CM | POA: Diagnosis not present

## 2023-01-21 DIAGNOSIS — M25551 Pain in right hip: Secondary | ICD-10-CM | POA: Diagnosis not present

## 2023-01-29 ENCOUNTER — Ambulatory Visit
Admission: RE | Admit: 2023-01-29 | Discharge: 2023-01-29 | Disposition: A | Discharge status: Home / Self Care | Payer: PPO | Source: Ambulatory Visit | Attending: Internal Medicine | Admitting: Internal Medicine

## 2023-01-29 DIAGNOSIS — Z1231 Encounter for screening mammogram for malignant neoplasm of breast: Secondary | ICD-10-CM | POA: Diagnosis not present

## 2023-02-04 DIAGNOSIS — M7671 Peroneal tendinitis, right leg: Secondary | ICD-10-CM | POA: Diagnosis not present

## 2023-02-04 DIAGNOSIS — M7661 Achilles tendinitis, right leg: Secondary | ICD-10-CM | POA: Diagnosis not present

## 2023-02-04 DIAGNOSIS — S93491A Sprain of other ligament of right ankle, initial encounter: Secondary | ICD-10-CM | POA: Diagnosis not present

## 2023-02-21 NOTE — Therapy (Signed)
 OUTPATIENT PHYSICAL THERAPY LOWER EXTREMITY EVALUATION   Patient Name: Teresa Lamb MRN: [id number] DOB:1950-12-14, 73 y.o., female Today's Date: 02/24/2023  END OF SESSION:  PT End of Session - 02/24/23 1413     Visit Number 1    Date for PT Re-Evaluation 05/19/23    Authorization Type HTA    PT Start Time 1400    PT Stop Time 1445    PT Time Calculation (min) 45 min    Activity Tolerance Patient tolerated treatment well             Past Medical History:  Diagnosis Date   Hyperlipemia    Insomnia    Osteopenia    Plantar fasciitis    Past Surgical History:  Procedure Laterality Date   CHOLECYSTECTOMY     TONSILLECTOMY AND ADENOIDECTOMY     VAGINAL HYSTERECTOMY     Patient Active Problem List   Diagnosis Date Noted   Right thyroid  nodule 09/06/2014   Precordial chest pain 07/15/2014   Abnormality of gait 10/26/2013   Sinus tarsi syndrome of left ankle 09/09/2013    PCP: Oneil Neth   REFERRING PROVIDER: Asberry Kobs   REFERRING DIAG:  M76.71 (ICD-10-CM) - Peroneal tendinitis, right leg   Z48.89 (ICD-10-CM) - Encounter for other specified surgical aftercare     THERAPY DIAG:  No diagnosis found.  Rationale for Evaluation and Treatment: Rehabilitation  ONSET DATE: 12/24/22  SUBJECTIVE:   SUBJECTIVE STATEMENT: I think I hurt my mensicus one night when I got up too quick to help my mom. I had surgery Nov 5th, it helped the pain I was in. 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 The R hip hurts at night, I can't lay on it. I got a shot around end of Nov so it is a little better but still there.   PERTINENT HISTORY: 12/24/22- s/p left knee arthroscopy with medial meniscal debridement R hip trochanteric bursitis  12/03/22- R lateral ankle sprain w/peroneal tendinitis   PAIN:  Are you having pain? Yes: NPRS scale: 4/10 Pain location: L knee, R foot, R hip  Pain description: dull, achy  Aggravating factors: sitting for too long, driving, walking and  standing too long  Relieving factors: Tylenol at night   PRECAUTIONS: None  RED FLAGS: None   WEIGHT BEARING RESTRICTIONS: No  FALLS:  Has patient fallen in last 6 months? 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   PATIENT SURVEYS:  FOTO 49  COGNITION: Overall cognitive status: Within functional limits for tasks assessed     SENSATION: WFL   MUSCLE LENGTH: Hamstrings: BLE tightness  POSTURE: No Significant postural limitations  PALPATION: No TTP   LOWER EXTREMITY ROM: All WNL   LOWER EXTREMITY MMT: 4/5 BLE    FUNCTIONAL TESTS:  5 times sit to stand: 19.36s Timed up and go (TUG): 13.38s   GAIT: Distance walked: in clinic distances Assistive device utilized: None Level of assistance: Modified independence Comments: decreased stance time, decreased  stride length, trendelenburg                                                                                                                     TREATMENT DATE: 02/24/23- EVAL     PATIENT EDUCATION:  Education details: POC and HEP Person educated: Patient Education method: Explanation Education comprehension: verbalized understanding  HOME EXERCISE PROGRAM: Access Code: [email or web address] URL: [email or web address] Date: 02/24/2023 Prepared by: Almetta Fam  Exercises - Heel Raises with Counter Support  - 1 x daily - 7 x weekly - 2 sets - 10 reps - Sit to Stand  - 1 x daily - 7 x  weekly - 2 sets - 10 reps - Seated Hip Abduction with Resistance  - 1 x daily - 7 x weekly - 2 sets - 10 reps - Seated Knee Extension with Resistance  - 1 x daily - 7 x weekly - 2 sets - 10 reps  ASSESSMENT:  CLINICAL IMPRESSION: Patient is a 73 y.o. female who was seen today for physical therapy evaluation and treatment for R hip and ankle pain and L knee pain. She had medial meniscal debridement 12/24/22. The pain she was experiencing prior to surgery has resolved. She still notes discomfort in the knee, but a lot of this may be due to compensation for the right hip and right ankle pain. The right hip pain is consistent with trochanteric bursitis. She received a trochanteric bursa injection and that has helped her pain. Patient reports most pain is in her R foot/ankle. She was referred here for peroneal tendinitis. She presents with an antalgic gait pattern and would like to work on her strength and balance to be able to take care of her mom and complete her household chores with ease.   OBJECTIVE IMPAIRMENTS: Abnormal gait, decreased activity tolerance, decreased balance, decreased ROM, decreased strength, improper body mechanics, and pain.   ACTIVITY LIMITATIONS: sitting, standing, squatting, stairs, transfers, and locomotion level  PARTICIPATION LIMITATIONS: driving, shopping, community activity, and yard work  [facility] POTENTIAL: Good  CLINICAL DECISION MAKING: Stable/uncomplicated  EVALUATION COMPLEXITY: Low   GOALS: Goals reviewed with patient? Yes  SHORT TERM GOALS: Target date:04/07/23  Patient will be independent with initial HEP. Baseline: given 02/24/23 Goal status: INITIAL  2.  Patient will demonstrate decreased fall risk by scoring < 12 sec on TUG. Baseline: 13.38s Goal status: INITIAL   LONG TERM GOALS: Target date: 05/19/23  Patient will be independent with advanced/ongoing HEP to improve outcomes and carryover.  Baseline:  Goal status: INITIAL  2.  Patient will be  able to ambulate 600' with normalized gait pattern and good safety to access community.  Baseline: antalgic gait  Goal status: INITIAL  3.  Patient will be able to navigate 2 flights of steps without increase in pain .  Baseline: pain with stairs  Goal status: INITIAL   4.  Patient will demonstrate improved functional LE strength as demonstrated by 5xSTS without UE use from chair height <15s. Baseline: 19.36s Goal status:  INITIAL  5.  Patient will report pain levels <2/10 for hip, knee and ankle.  Baseline: 4/10 Goal status: INITIAL   PT FREQUENCY: 2x/week  PT DURATION: 12 weeks  PLANNED INTERVENTIONS: 97110-Therapeutic exercises, 97530- Therapeutic activity, [id number]- Neuromuscular re-education, 97535- Self Care, 02859- Manual therapy, [id number]- Gait training, 97014- Electrical stimulation (unattended), [phone]- Electrical stimulation (manual), [id number]- Ultrasound, 02966- Ionotophoresis 4mg /ml Dexamethasone, Patient/Family education, Balance training, Stair training, Taping, Dry Needling, Joint mobilization, Spinal mobilization, Cryotherapy, and Moist heat  PLAN FOR NEXT SESSION: start with hip and knee strengthening, can try US  or ionto or vaso for ankle swelling and tendonitis     Almetta Fam, PT 02/24/2023, 2:53 PM

## 2023-02-24 ENCOUNTER — Ambulatory Visit: Payer: PPO | Attending: Student

## 2023-02-24 DIAGNOSIS — M25551 Pain in right hip: Secondary | ICD-10-CM | POA: Insufficient documentation

## 2023-02-24 DIAGNOSIS — M6281 Muscle weakness (generalized): Secondary | ICD-10-CM | POA: Diagnosis not present

## 2023-02-24 DIAGNOSIS — M7061 Trochanteric bursitis, right hip: Secondary | ICD-10-CM | POA: Insufficient documentation

## 2023-02-24 DIAGNOSIS — M25571 Pain in right ankle and joints of right foot: Secondary | ICD-10-CM | POA: Diagnosis not present

## 2023-02-24 DIAGNOSIS — Z9889 Other specified postprocedural states: Secondary | ICD-10-CM | POA: Insufficient documentation

## 2023-02-24 DIAGNOSIS — R2689 Other abnormalities of gait and mobility: Secondary | ICD-10-CM | POA: Insufficient documentation

## 2023-03-04 ENCOUNTER — Ambulatory Visit: Payer: PPO

## 2023-03-06 ENCOUNTER — Ambulatory Visit: Payer: PPO

## 2023-03-06 DIAGNOSIS — R2689 Other abnormalities of gait and mobility: Secondary | ICD-10-CM

## 2023-03-06 DIAGNOSIS — M6281 Muscle weakness (generalized): Secondary | ICD-10-CM

## 2023-03-06 DIAGNOSIS — N3281 Overactive bladder: Secondary | ICD-10-CM | POA: Diagnosis not present

## 2023-03-06 DIAGNOSIS — N398 Other specified disorders of urinary system: Secondary | ICD-10-CM | POA: Diagnosis not present

## 2023-03-06 DIAGNOSIS — M25571 Pain in right ankle and joints of right foot: Secondary | ICD-10-CM | POA: Diagnosis not present

## 2023-03-06 DIAGNOSIS — R351 Nocturia: Secondary | ICD-10-CM | POA: Diagnosis not present

## 2023-03-06 DIAGNOSIS — M7061 Trochanteric bursitis, right hip: Secondary | ICD-10-CM

## 2023-03-06 DIAGNOSIS — R350 Frequency of micturition: Secondary | ICD-10-CM | POA: Diagnosis not present

## 2023-03-06 DIAGNOSIS — Z9889 Other specified postprocedural states: Secondary | ICD-10-CM

## 2023-03-06 DIAGNOSIS — M25551 Pain in right hip: Secondary | ICD-10-CM

## 2023-03-06 DIAGNOSIS — N958 Other specified menopausal and perimenopausal disorders: Secondary | ICD-10-CM | POA: Diagnosis not present

## 2023-03-06 NOTE — Therapy (Addendum)
OUTPATIENT PHYSICAL THERAPY LOWER EXTREMITY TREATMENT   Patient Name: Teresa Lamb MRN: [id number] DOB:March 01, 1950, 73 y.o., female Today's Date: 03/06/2023  END OF SESSION:    Past Medical History:  Diagnosis Date   Hyperlipemia    Insomnia    Osteopenia    Plantar fasciitis    Past Surgical History:  Procedure Laterality Date   CHOLECYSTECTOMY     TONSILLECTOMY AND ADENOIDECTOMY     VAGINAL HYSTERECTOMY     Patient Active Problem List   Diagnosis Date Noted   Right thyroid nodule 09/06/2014   Precordial chest pain 07/15/2014   Abnormality of gait 10/26/2013   Sinus tarsi syndrome of left ankle 09/09/2013    PCP: Rodrigo Ran   REFERRING PROVIDER: Eartha Inch   REFERRING DIAG:  M76.71 (ICD-10-CM) - Peroneal tendinitis, right leg   Z48.89 (ICD-10-CM) - Encounter for other specified surgical aftercare     THERAPY DIAG:  No diagnosis found.  Rationale for Evaluation and Treatment: Rehabilitation  ONSET DATE: 12/24/22  SUBJECTIVE:   SUBJECTIVE STATEMENT: Pt noted having more pain in her right hip was having more pain today. Saw her doctor to check on meniscus surgery, said the check went well. Unable to sleep on right side due to pain in her hip.   PERTINENT HISTORY: 12/24/22- s/p left knee arthroscopy with medial meniscal debridement R hip trochanteric bursitis  12/03/22- R lateral ankle sprain w/peroneal tendinitis   PAIN:  Are you having pain? Yes: NPRS scale: 1/10 Pain location: L knee, R foot, R hip  Pain description: dull, achy  Aggravating factors: sitting for too long, driving, walking and standing too long  Relieving factors: Tylenol at night   PRECAUTIONS: None  RED FLAGS: None   WEIGHT BEARING RESTRICTIONS: No  FALLS:  Has patient fallen in last 6 months? 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   PATIENT SURVEYS:  FOTO 49  COGNITION: Overall cognitive status: Within functional limits for tasks assessed     SENSATION: WFL   MUSCLE LENGTH: Hamstrings: BLE tightness  POSTURE: No Significant postural limitations  PALPATION: No TTP   LOWER EXTREMITY ROM: All WNL   LOWER EXTREMITY MMT: 4/5 BLE    FUNCTIONAL TESTS:  5 times sit to stand: 19.36s Timed up and go (TUG): 13.38s   GAIT: Distance walked: in clinic distances Assistive device utilized: None Level of assistance: Modified independence Comments: decreased stance time, decreased stride length, trendelenburg  TREATMENT DATE: 02/24/23- EVAL   03/06/23 NuStep L5 x LAQ 2x10 each leg HS green band 2x10 Hip Flexion green band 2x8, cued to sit up tall Hip Adduction with ball  2x12 Step ups 4 inch 1x10 4 inch stairs 2x5 up and down 6 inch stairs 1x5 up and down   PATIENT EDUCATION:  Education details: POC and HEP Person educated: Patient Education method: Explanation Education comprehension: verbalized understanding  HOME EXERCISE PROGRAM: Access Code: [email or web address] URL: [email or web address] Date: 02/24/2023 Prepared by: Cassie Freer  Exercises - Heel Raises with Counter Support  - 1 x daily - 7 x weekly - 2 sets - 10 reps - Sit to Stand  - 1 x daily - 7 x weekly - 2 sets - 10 reps - Seated Hip Abduction with Resistance  - 1 x daily - 7 x weekly - 2 sets - 10 reps - Seated Knee  Extension with Resistance  - 1 x daily - 7 x weekly - 2 sets - 10 reps  ASSESSMENT:  CLINICAL IMPRESSION: Patient is a 73 y.o. female who was seen today for physical therapy treatment for R hip and ankle pain and L knee pain. Pt tolerated seated strengthening well. We will continue to work specifically on hip flexion strength to help improve stair negotiation and gait. She mentioned wanting to work more on stair negotiation, STS, and STS with walking right afterwards. Pt will continue to benefit from skilled PT to increase strength to allow for safe ambulation and improvement of pain symptoms.     OBJECTIVE IMPAIRMENTS: Abnormal gait, decreased activity tolerance, decreased balance, decreased ROM, decreased strength, improper body mechanics, and pain.   ACTIVITY LIMITATIONS: sitting, standing, squatting, stairs, transfers, and locomotion level  PARTICIPATION LIMITATIONS: driving, shopping, community activity, and yard work  [facility] POTENTIAL: Good  CLINICAL DECISION MAKING: Stable/uncomplicated  EVALUATION COMPLEXITY: Low   GOALS: Goals reviewed with patient? Yes  SHORT TERM GOALS: Target date:04/07/23  Patient will be independent with initial HEP. Baseline: given 02/24/23 Goal status: INITIAL  2.  Patient will demonstrate decreased fall risk by scoring < 12 sec on TUG. Baseline: 13.38s Goal status: INITIAL   LONG TERM GOALS: Target date: 05/19/23  Patient will be independent with advanced/ongoing HEP to improve outcomes and carryover.  Baseline:  Goal status: INITIAL  2.  Patient will be able to ambulate 600' with normalized gait pattern and good safety to access community.  Baseline: antalgic gait  Goal status: INITIAL  3.  Patient will be able to navigate 2 flights of steps without increase in pain .  Baseline: pain with stairs  Goal status: INITIAL   4.  Patient will demonstrate improved functional LE strength as demonstrated by 5xSTS without UE use from chair height  <15s. Baseline: 19.36s Goal status: INITIAL  5.  Patient will report pain levels <2/10 for hip, knee and ankle.  Baseline: 4/10 Goal status: INITIAL   PT FREQUENCY: 2x/week  PT DURATION: 12 weeks  PLANNED INTERVENTIONS: 97110-Therapeutic exercises, 97530- Therapeutic activity, [id number]- Neuromuscular re-education, 97535- Self Care, 03500- Manual therapy, [id number]- Gait training, 97014- Electrical stimulation (unattended), [phone]- Electrical stimulation (manual), [id number]- Ultrasound, 29937- Ionotophoresis 4mg /ml Dexamethasone, Patient/Family education, Balance training, Stair training, Taping, Dry Needling, Joint mobilization, Spinal mobilization, Cryotherapy, and Moist heat  PLAN FOR NEXT SESSION: start with hip and knee strengthening, can try [place] or ionto or vaso for ankle swelling and tendonitis, STS, stair negotiation   Pascal Lux, Student-PT 03/06/2023, 3:44 PM

## 2023-03-10 NOTE — Therapy (Incomplete)
OUTPATIENT PHYSICAL THERAPY LOWER EXTREMITY TREATMENT   Patient Name: MONETTE CHAMU MRN: [id number] DOB:1950-04-03, 73 y.o., female Today's Date: 03/11/2023  END OF SESSION:  PT End of Session - 03/11/23 1626     Visit Number 3    Date for PT Re-Evaluation 05/19/23    Authorization Type HTA    PT Start Time 1626    PT Stop Time 1711    PT Time Calculation (min) 45 min    Activity Tolerance Patient tolerated treatment well    Behavior During Therapy [facility] for tasks assessed/performed              Past Medical History:  Diagnosis Date   Hyperlipemia    Insomnia    Osteopenia    Plantar fasciitis    Past Surgical History:  Procedure Laterality Date   CHOLECYSTECTOMY     TONSILLECTOMY AND ADENOIDECTOMY     VAGINAL HYSTERECTOMY     Patient Active Problem List   Diagnosis Date Noted   Right thyroid nodule 09/06/2014   Precordial chest pain 07/15/2014   Abnormality of gait 10/26/2013   Sinus tarsi syndrome of left ankle 09/09/2013    PCP: Rodrigo Ran   REFERRING PROVIDER: Eartha Inch   REFERRING DIAG:  M76.71 (ICD-10-CM) - Peroneal tendinitis, right leg   Z48.89 (ICD-10-CM) - Encounter for other specified surgical aftercare     THERAPY DIAG:  No diagnosis found.  Rationale for Evaluation and Treatment: Rehabilitation  ONSET DATE: 12/24/22  SUBJECTIVE:   SUBJECTIVE STATEMENT: Pt noted having some pain in her left knee after she was vacuuming and cleaning yesterday. Knee is the only source of pain today.   PERTINENT HISTORY: 12/24/22- s/p left knee arthroscopy with medial meniscal debridement R hip trochanteric bursitis  12/03/22- R lateral ankle sprain w/peroneal tendinitis   PAIN:  Are you having pain? Yes: NPRS scale: 1/10 Pain location: L knee, R foot, R hip  Pain description: dull, achy  Aggravating factors: sitting for too long, driving, walking and standing too long  Relieving factors: Tylenol at night   PRECAUTIONS: None  RED  FLAGS: None   WEIGHT BEARING RESTRICTIONS: No  FALLS:  Has patient fallen in last 6 months? 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   PATIENT SURVEYS:  FOTO 49  COGNITION: Overall cognitive status: Within functional limits for tasks assessed     SENSATION: WFL   MUSCLE LENGTH: Hamstrings: BLE tightness  POSTURE: No Significant postural limitations  PALPATION: No TTP   LOWER EXTREMITY ROM: All WNL   LOWER EXTREMITY MMT: 4/5 BLE    FUNCTIONAL TESTS:  5 times sit to stand: 19.36s Timed up and go (TUG): 13.38s   GAIT: Distance walked: in clinic distances Assistive device utilized: None Level of assistance: Modified independence Comments: decreased stance time, decreased stride length, trendelenburg  TREATMENT DATE: 02/24/23- EVAL   03/11/23 NuStep L5x40min Seated Marches 2x20, cued to sit up tall STS 2x10 Seated Hip abduction 2x12, green band  6 inch step ups 2x10 Slant Board Stretch 3x30 seconds Calf raises 2x10 Ant Tib Raises 2x10  6 inch stairs 2x5 up and down   STS + walk 10 ft x 4 lengths Ionto patch to right hip (#1)  03/06/23 NuStep L5 x LAQ 2x10 each leg HS green band 2x10 Hip Flexion green band 2x8, cued to sit up tall Hip Adduction with ball  2x12 Step ups 4 inch 1x10 4 inch stairs 2x5 up and down 6  inch stairs 1x5 up and down   PATIENT EDUCATION:  Education details: POC and HEP Person educated: Patient Education method: Explanation Education comprehension: verbalized understanding  HOME EXERCISE PROGRAM: Access Code: [email or web address] URL: [email or web address] Date: 02/24/2023 Prepared by: Cassie Freer  Exercises - Heel Raises with Counter Support  - 1 x daily - 7 x weekly - 2 sets - 10 reps - Sit to Stand  - 1 x daily - 7 x weekly - 2 sets - 10 reps - Seated Hip Abduction with Resistance  - 1 x daily - 7 x weekly - 2 sets - 10 reps - Seated Knee Extension with Resistance  - 1 x daily - 7 x weekly - 2 sets - 10 reps  ASSESSMENT:  CLINICAL IMPRESSION: Patient is a 73 y.o. female who was seen today for physical therapy treatment for R hip and ankle pain and L knee pain. Pt tolerated exercises well. She was challenged more with standing exercises and stair negotiation. She noted more pain in her left knee due to household chores, specifically vacuuming. She began to have some pain in her right hip near greater trochanter most likely due to bursitis. Pt was educated to utilize ice instead of heat to help alleviate hip pain. Tried an ionto patch to see if it may provide some pain relief from bursitis in hip.    OBJECTIVE IMPAIRMENTS: Abnormal gait, decreased activity tolerance, decreased balance, decreased ROM, decreased strength, improper body mechanics, and pain.   ACTIVITY LIMITATIONS: sitting, standing, squatting, stairs, transfers, and locomotion level  PARTICIPATION LIMITATIONS: driving, shopping, community activity, and yard work  [facility] POTENTIAL: Good  CLINICAL DECISION MAKING: Stable/uncomplicated  EVALUATION COMPLEXITY: Low   GOALS: Goals reviewed with patient? Yes  SHORT TERM GOALS: Target date:04/07/23  Patient will be independent with initial HEP. Baseline: given 02/24/23 Goal status: INITIAL  2.  Patient will demonstrate decreased fall risk by scoring  < 12 sec on TUG. Baseline: 13.38s Goal status: INITIAL   LONG TERM GOALS: Target date: 05/19/23  Patient will be independent with advanced/ongoing HEP to improve outcomes and carryover.  Baseline:  Goal status: INITIAL  2.  Patient will be able to ambulate 600' with normalized gait pattern and good safety to access community.  Baseline: antalgic gait  Goal status: INITIAL  3.  Patient will be able to navigate 2 flights of steps without increase in pain .  Baseline: pain with stairs  Goal status: INITIAL   4.  Patient will demonstrate improved functional LE strength as demonstrated by 5xSTS without UE use from chair height <15s. Baseline: 19.36s Goal status: INITIAL  5.  Patient will report pain levels <2/10 for hip, knee and ankle.  Baseline: 4/10, 1/10 03/01/13 Goal status: MET   PT FREQUENCY: 2x/week  PT DURATION: 12 weeks  PLANNED INTERVENTIONS: 97110-Therapeutic exercises, 97530- Therapeutic activity, 97112- Neuromuscular re-education,  28413- Self Care, 24401- Manual therapy, [id number]- Gait training, [phone]- Electrical stimulation (unattended), [id number]- Electrical stimulation (manual), [id number]- Ultrasound, [id number]- Ionotophoresis 4mg /ml Dexamethasone, Patient/Family education, Balance training, Stair training, Taping, Dry Needling, Joint mobilization, Spinal mobilization, Cryotherapy, and Moist heat  PLAN FOR NEXT SESSION: start with hip and knee strengthening, can try [place] or ionto or vaso for ankle swelling and tendonitis, STS, stair negotiation   Pascal Lux, Student-PT 03/11/2023, 4:30 PM

## 2023-03-11 ENCOUNTER — Ambulatory Visit: Payer: PPO

## 2023-03-11 ENCOUNTER — Other Ambulatory Visit: Payer: Self-pay

## 2023-03-11 DIAGNOSIS — M6281 Muscle weakness (generalized): Secondary | ICD-10-CM

## 2023-03-11 DIAGNOSIS — N3946 Mixed incontinence: Secondary | ICD-10-CM | POA: Diagnosis not present

## 2023-03-11 DIAGNOSIS — R2689 Other abnormalities of gait and mobility: Secondary | ICD-10-CM

## 2023-03-11 DIAGNOSIS — M25571 Pain in right ankle and joints of right foot: Secondary | ICD-10-CM | POA: Diagnosis not present

## 2023-03-11 DIAGNOSIS — M25551 Pain in right hip: Secondary | ICD-10-CM

## 2023-03-11 DIAGNOSIS — Z9889 Other specified postprocedural states: Secondary | ICD-10-CM

## 2023-03-13 ENCOUNTER — Ambulatory Visit: Payer: PPO

## 2023-03-13 DIAGNOSIS — N3281 Overactive bladder: Secondary | ICD-10-CM | POA: Diagnosis not present

## 2023-03-13 DIAGNOSIS — R350 Frequency of micturition: Secondary | ICD-10-CM | POA: Diagnosis not present

## 2023-03-17 ENCOUNTER — Ambulatory Visit: Payer: PPO

## 2023-03-17 ENCOUNTER — Other Ambulatory Visit: Payer: Self-pay

## 2023-03-17 DIAGNOSIS — R2689 Other abnormalities of gait and mobility: Secondary | ICD-10-CM

## 2023-03-17 DIAGNOSIS — M6281 Muscle weakness (generalized): Secondary | ICD-10-CM

## 2023-03-17 DIAGNOSIS — Z9889 Other specified postprocedural states: Secondary | ICD-10-CM

## 2023-03-17 DIAGNOSIS — M25571 Pain in right ankle and joints of right foot: Secondary | ICD-10-CM | POA: Diagnosis not present

## 2023-03-17 DIAGNOSIS — M25551 Pain in right hip: Secondary | ICD-10-CM

## 2023-03-17 DIAGNOSIS — M7061 Trochanteric bursitis, right hip: Secondary | ICD-10-CM

## 2023-03-17 NOTE — Therapy (Signed)
OUTPATIENT PHYSICAL THERAPY LOWER EXTREMITY TREATMENT   Patient Name: Teresa Lamb MRN: [id number] DOB:1950-07-17, 73 y.o., female Today's Date: 03/17/2023  END OF SESSION:     Past Medical History:  Diagnosis Date   Hyperlipemia    Insomnia    Osteopenia    Plantar fasciitis    Past Surgical History:  Procedure Laterality Date   CHOLECYSTECTOMY     TONSILLECTOMY AND ADENOIDECTOMY     VAGINAL HYSTERECTOMY     Patient Active Problem List   Diagnosis Date Noted   Right thyroid nodule 09/06/2014   Precordial chest pain 07/15/2014   Abnormality of gait 10/26/2013   Sinus tarsi syndrome of left ankle 09/09/2013    PCP: Rodrigo Ran   REFERRING PROVIDER: Eartha Inch   REFERRING DIAG:  M76.71 (ICD-10-CM) - Peroneal tendinitis, right leg   Z48.89 (ICD-10-CM) - Encounter for other specified surgical aftercare     THERAPY DIAG:  No diagnosis found.  Rationale for Evaluation and Treatment: Rehabilitation  ONSET DATE: 12/24/22  SUBJECTIVE:   SUBJECTIVE STATEMENT: Pt noted that her knee was somewhat aggravated. Pt said the iontophoresis patch done the last time did help.   PERTINENT HISTORY: 12/24/22- s/p left knee arthroscopy with medial meniscal debridement R hip trochanteric bursitis  12/03/22- R lateral ankle sprain w/peroneal tendinitis   PAIN:  Are you having pain? Yes: NPRS scale: 1/10 Pain location: L knee, R foot, R hip  Pain description: dull, achy  Aggravating factors: sitting for too long, driving, walking and standing too long  Relieving factors: Tylenol at night   PRECAUTIONS: None  RED FLAGS: None   WEIGHT BEARING RESTRICTIONS: No  FALLS:  Has patient fallen in last 6 months? 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   PATIENT SURVEYS:  FOTO 49  COGNITION: Overall cognitive status: Within functional limits for tasks assessed     SENSATION: WFL   MUSCLE LENGTH: Hamstrings: BLE tightness  POSTURE: No Significant postural limitations  PALPATION: No TTP   LOWER EXTREMITY ROM: All WNL   LOWER EXTREMITY MMT: 4/5 BLE    FUNCTIONAL TESTS:  5 times sit to stand: 19.36s Timed up and go (TUG): 13.38s   GAIT: Distance walked: in clinic distances Assistive device utilized: None Level of assistance: Modified independence Comments: decreased stance time, decreased stride length, trendelenburg  TREATMENT DATE: 02/24/23- EVAL   03/17/23 Bike L2x70min Leg Extension 5# 2x8 HS Curls 10# 1x8, 15# 1x8 Banded Side Steps green band 8 lengths of parallel bars  6 inch Step Ups 2x10 each side STS + walk 20 ft x 3 lengths  03/11/23 NuStep L5x38min Seated Marches 2x20, cued to sit up tall STS 2x10 Seated Hip abduction 2x12, green band  6 inch step ups 2x10 Slant Board Stretch 3x30 seconds Calf raises 2x10 Ant Tib Raises 2x10  6 inch stairs 2x5 up and down   STS + walk 10 ft x 4 lengths Ionto patch to right hip (#1)  03/06/23 NuStep L5 x LAQ 2x10 each leg HS green band 2x10 Hip Flexion green band 2x8, cued to sit up tall Hip Adduction with ball  2x12 Step ups 4 inch 1x10 4 inch stairs 2x5 up and down 6 inch stairs 1x5 up and down   PATIENT EDUCATION:  Education details: POC and HEP Person educated: Patient Education method: Explanation Education comprehension:  verbalized understanding  HOME EXERCISE PROGRAM: Access Code: [email or web address] URL: [email or web address] Date: 02/24/2023 Prepared by: Cassie Freer  Exercises - Heel Raises with Counter Support  - 1 x daily - 7 x weekly - 2 sets - 10 reps - Sit to Stand  - 1 x daily - 7 x weekly - 2 sets - 10 reps - Seated Hip Abduction with Resistance  - 1 x daily - 7 x weekly - 2 sets - 10 reps - Seated Knee Extension with Resistance  - 1 x daily - 7 x weekly - 2 sets - 10 reps Supine trunk rotations - 1 x daily - 7 x weekly - 2 sets - 10 reps  ASSESSMENT:  CLINICAL IMPRESSION: Patient is a 73 y.o. female who was seen today for physical therapy treatment for R hip and ankle pain and L knee pain. Pt tolerated progression of exercises well. She was unable to do monster walks after a few explanations, so we plan to stick to side steps. She was unable to complete bridges or standing hip extension due to weakness. In the next session, we plan to work on posterior chain strengthening and core. Pt will continue to benefit from skilled PT to increase overall strength to help alleviate excess stress on joints.  OBJECTIVE IMPAIRMENTS: Abnormal gait, decreased activity tolerance, decreased balance, decreased ROM, decreased strength, improper body mechanics, and pain.   ACTIVITY LIMITATIONS: sitting, standing, squatting, stairs, transfers, and locomotion level  PARTICIPATION LIMITATIONS: driving, shopping, community activity, and yard work  [facility] POTENTIAL: Good  CLINICAL DECISION MAKING: Stable/uncomplicated  EVALUATION COMPLEXITY: Low   GOALS: Goals reviewed with patient? Yes  SHORT TERM GOALS: Target date:04/07/23  Patient will be independent with initial HEP. Baseline: given 02/24/23 Goal status: INITIAL  2.  Patient will demonstrate decreased fall risk by scoring < 12 sec on TUG. Baseline: 13.38s Goal status: INITIAL   LONG TERM GOALS: Target date: 05/19/23  Patient will be independent  with advanced/ongoing HEP to improve outcomes and carryover.  Baseline:  Goal status: INITIAL  2.  Patient will be able to ambulate 600' with normalized gait pattern and good safety to access community.  Baseline: antalgic gait  Goal status: INITIAL  3.  Patient will be able to navigate 2 flights of steps without increase in pain .  Baseline: pain with stairs  Goal status: INITIAL   4.  Patient will demonstrate improved functional LE strength as demonstrated by 5xSTS without UE use from chair height <  15s. Baseline: 19.36s Goal status: INITIAL  5.  Patient will report pain levels <2/10 for hip, knee and ankle.  Baseline: 4/10, 1/10 03/01/13 Goal status: MET   PT FREQUENCY: 2x/week  PT DURATION: 12 weeks  PLANNED INTERVENTIONS: 97110-Therapeutic exercises, 97530- Therapeutic activity, 97112- Neuromuscular re-education, 97535- Self Care, 21308- Manual therapy, [phone]- Gait training, 97014- Electrical stimulation (unattended), [phone]- Electrical stimulation (manual), [id number]- Ultrasound, 52841- Ionotophoresis 4mg /ml Dexamethasone, Patient/Family education, Balance training, Stair training, Taping, Dry Needling, Joint mobilization, Spinal mobilization, Cryotherapy, and Moist heat  PLAN FOR NEXT SESSION: start with hip and knee strengthening, can try [place] or ionto or vaso for ankle swelling and tendonitis, STS, stair negotiation   Pascal Lux, Student-PT 03/17/2023, 11:41 AM

## 2023-03-19 ENCOUNTER — Other Ambulatory Visit: Payer: Self-pay

## 2023-03-19 ENCOUNTER — Ambulatory Visit: Payer: PPO

## 2023-03-19 DIAGNOSIS — Z9889 Other specified postprocedural states: Secondary | ICD-10-CM

## 2023-03-19 DIAGNOSIS — M25551 Pain in right hip: Secondary | ICD-10-CM

## 2023-03-19 DIAGNOSIS — M6281 Muscle weakness (generalized): Secondary | ICD-10-CM

## 2023-03-19 DIAGNOSIS — M25571 Pain in right ankle and joints of right foot: Secondary | ICD-10-CM | POA: Diagnosis not present

## 2023-03-19 DIAGNOSIS — R2689 Other abnormalities of gait and mobility: Secondary | ICD-10-CM

## 2023-03-19 DIAGNOSIS — M7061 Trochanteric bursitis, right hip: Secondary | ICD-10-CM

## 2023-03-19 NOTE — Therapy (Signed)
OUTPATIENT PHYSICAL THERAPY LOWER EXTREMITY TREATMENT   Patient Name: Teresa Lamb MRN: [id number] DOB:1950-03-25, 73 y.o., female Today's Date: 03/19/2023  END OF SESSION:  PT End of Session - 03/19/23 1320     Visit Number 5    Date for PT Re-Evaluation 05/19/23    Authorization Type HTA    PT Start Time 1320    PT Stop Time 1400    PT Time Calculation (min) 40 min    Activity Tolerance Patient tolerated treatment well    Behavior During Therapy WFL for tasks assessed/performed               Past Medical History:  Diagnosis Date   Hyperlipemia    Insomnia    Osteopenia    Plantar fasciitis    Past Surgical History:  Procedure Laterality Date   CHOLECYSTECTOMY     TONSILLECTOMY AND ADENOIDECTOMY     VAGINAL HYSTERECTOMY     Patient Active Problem List   Diagnosis Date Noted   Right thyroid nodule 09/06/2014   Precordial chest pain 07/15/2014   Abnormality of gait 10/26/2013   Sinus tarsi syndrome of left ankle 09/09/2013    PCP: Rodrigo Ran   REFERRING PROVIDER: Eartha Inch   REFERRING DIAG:  M76.71 (ICD-10-CM) - Peroneal tendinitis, right leg   Z48.89 (ICD-10-CM) - Encounter for other specified surgical aftercare     THERAPY DIAG:  Pain in right hip  Trochanteric bursitis of right hip  Muscle weakness (generalized)  S/P left knee arthroscopy  Other abnormalities of gait and mobility  Pain in right ankle and joints of right foot  Rationale for Evaluation and Treatment: Rehabilitation  ONSET DATE: 12/24/22  SUBJECTIVE:   SUBJECTIVE STATEMENT: Pt noted that her left knee was more sore today. She felt good after last session and only noticed the soreness around 24 hours after the last session.   PERTINENT HISTORY: 12/24/22- s/p left knee arthroscopy with medial meniscal debridement R hip trochanteric bursitis  12/03/22- R lateral ankle sprain w/peroneal tendinitis   PAIN:  Are you having pain? No, noted some soreness in the  left knee  PRECAUTIONS: None  RED FLAGS: None   WEIGHT BEARING RESTRICTIONS: No  FALLS:  Has patient fallen in last 6 months? 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   PATIENT SURVEYS:  FOTO 49  COGNITION: Overall cognitive status: Within functional limits for tasks assessed     SENSATION: WFL   MUSCLE LENGTH: Hamstrings: BLE tightness  POSTURE: No Significant postural limitations  PALPATION: No TTP   LOWER EXTREMITY ROM: All WNL   LOWER EXTREMITY MMT: 4/5 BLE    FUNCTIONAL TESTS:  5 times sit to stand: 19.36s Timed up and go (TUG): 13.38s   GAIT: Distance walked: in clinic distances Assistive device utilized: None Level of assistance: Modified independence Comments: decreased stance time, decreased stride length, trendelenburg  TREATMENT DATE: 02/24/23- EVAL   03/19/23 NuStep L5x59min LAQ 3# 2x10  SAQ 1/2 foam roller under knee, cuing for full knee extension 1x10 Banded Side Steps Green band 3x10 each leg around knees, 1x10 around ankles Standing Hip Extensions 1x10 left leg Prone hip Extensions 1x10 on each leg  03/17/23 Bike L2x26min Leg Extension 5# 2x8 HS Curls 10# 1x8, 15# 1x8 Banded Side Steps green band 8 lengths of parallel bars  6 inch Step Ups 2x10 each side STS + walk 20 ft x 3 lengths  03/11/23 NuStep  L5x28min Seated Marches 2x20, cued to sit up tall STS 2x10 Seated Hip abduction 2x12, green band  6 inch step ups 2x10 Slant Board Stretch 3x30 seconds Calf raises 2x10 Ant Tib Raises 2x10  6 inch stairs 2x5 up and down   STS + walk 10 ft x 4 lengths Ionto patch to right hip (#1)  03/06/23 NuStep L5 x LAQ 2x10 each leg HS green band 2x10 Hip Flexion green band 2x8, cued to sit up tall Hip Adduction with ball  2x12 Step ups 4 inch 1x10 4 inch stairs 2x5 up and down 6 inch stairs 1x5 up and down   PATIENT EDUCATION:  Education details: POC and HEP Person educated: Patient Education method: Explanation Education comprehension: verbalized understanding  HOME EXERCISE PROGRAM: Access Code: [email or web address] URL: [email or web address] Date: 02/24/2023 Prepared by: Cassie Freer  Exercises - Heel Raises with Counter Support  - 1 x daily - 7 x weekly - 2 sets - 10 reps - Sit to Stand  - 1 x daily - 7 x weekly - 2 sets - 10 reps - Seated Hip Abduction with Resistance  - 1 x daily - 7 x weekly - 2 sets - 10 reps - Seated Knee Extension with Resistance  - 1 x daily - 7 x weekly - 2 sets - 10 reps Supine trunk rotations - 1 x daily - 7 x weekly - 2 sets - 10 reps  ASSESSMENT:  CLINICAL IMPRESSION: Patient is a 73 y.o. female who was seen today for physical therapy treatment for R hip and ankle pain and L knee pain. I noticed today that she is lacking around 8 degrees of knee extension on her left knee and notes slight pain on the medial side with prolonged standing or walking. Pt tolerated exercises well. She does not feel her hip abductors working with banded side steps with the band around her knees. We tried it with the band around her ankles and had similar results, next time try it side lying but be weary of her tolerance of side lying on the right side due to hip pain. She had some knee pain when doing standing hip extension so we tried prone instead. She had good  activation prone, but noted some right hip pain when lifting her left leg off the table. Pt will continue to benefit from skilled PT to allow for modifications of exercises to ensure we are targeting and strengthening weak LE muscles.   OBJECTIVE IMPAIRMENTS: Abnormal gait, decreased activity tolerance, decreased balance, decreased ROM, decreased strength, improper body mechanics, and pain.   ACTIVITY LIMITATIONS: sitting, standing, squatting, stairs, transfers, and locomotion level  PARTICIPATION LIMITATIONS: driving, shopping, community activity, and yard work  [facility] POTENTIAL: Good  CLINICAL DECISION MAKING: Stable/uncomplicated  EVALUATION COMPLEXITY: Low   GOALS: Goals reviewed with patient? Yes  SHORT TERM GOALS: Target date:04/07/23  Patient will be independent with initial HEP. Baseline: given 02/24/23 Goal status: INITIAL  2.  Patient will demonstrate decreased fall risk by scoring < 12 sec on TUG. Baseline: 13.38s Goal status: INITIAL   LONG TERM GOALS: Target date: 05/19/23  Patient will be independent with advanced/ongoing HEP to improve outcomes and carryover.  Baseline:  Goal status: INITIAL  2.  Patient will be able to ambulate 600' with normalized gait pattern and good safety to access community.  Baseline: antalgic gait  Goal status: INITIAL  3.  Patient will be able to navigate 2 flights of steps without increase in pain .  Baseline: pain with stairs  Goal status: INITIAL   4.  Patient will demonstrate improved functional LE strength as demonstrated by 5xSTS without UE use from chair height <15s. Baseline: 19.36s Goal status: INITIAL  5.  Patient will report pain levels <2/10 for hip, knee and ankle.  Baseline: 4/10, 1/10 03/01/13 Goal status: MET   PT FREQUENCY: 2x/week  PT DURATION: 12 weeks  PLANNED INTERVENTIONS: 97110-Therapeutic exercises, 97530- Therapeutic activity, 97112- Neuromuscular re-education, 97535- Self Care, 16109- Manual therapy,  [id number]- Gait training, 97014- Electrical stimulation (unattended), [phone]- Electrical stimulation (manual), [id number]- Ultrasound, 09811- Ionotophoresis 4mg /ml Dexamethasone, Patient/Family education, Balance training, Stair training, Taping, Dry Needling, Joint mobilization, Spinal mobilization, Cryotherapy, and Moist heat  PLAN FOR NEXT SESSION: start with hip and knee strengthening, can try [place] or ionto or vaso for ankle swelling and tendonitis, STS, stair negotiation   Pascal Lux, Student-PT 03/19/2023, 2:07 PM

## 2023-03-24 ENCOUNTER — Ambulatory Visit: Payer: PPO

## 2023-03-26 ENCOUNTER — Ambulatory Visit: Payer: PPO | Attending: Student

## 2023-03-26 DIAGNOSIS — Z9889 Other specified postprocedural states: Secondary | ICD-10-CM | POA: Diagnosis not present

## 2023-03-26 DIAGNOSIS — M25571 Pain in right ankle and joints of right foot: Secondary | ICD-10-CM | POA: Diagnosis not present

## 2023-03-26 DIAGNOSIS — M6281 Muscle weakness (generalized): Secondary | ICD-10-CM | POA: Insufficient documentation

## 2023-03-26 DIAGNOSIS — R2689 Other abnormalities of gait and mobility: Secondary | ICD-10-CM | POA: Insufficient documentation

## 2023-03-26 DIAGNOSIS — M25551 Pain in right hip: Secondary | ICD-10-CM | POA: Diagnosis not present

## 2023-03-26 DIAGNOSIS — M7061 Trochanteric bursitis, right hip: Secondary | ICD-10-CM | POA: Insufficient documentation

## 2023-03-26 NOTE — Therapy (Signed)
 OUTPATIENT PHYSICAL THERAPY LOWER EXTREMITY TREATMENT   Patient Name: Teresa Lamb MRN: [id number] DOB:01-25-1951, 73 y.o., female Today's Date: 03/26/2023  END OF SESSION:  PT End of Session - 03/26/23 1321     Visit Number 6    Date for PT Re-Evaluation 05/19/23    Authorization Type HTA    PT Start Time 1315    PT Stop Time 1400    PT Time Calculation (min) 45 min    Activity Tolerance Patient tolerated treatment well    Behavior During Therapy WFL for tasks assessed/performed                Past Medical History:  Diagnosis Date   Hyperlipemia    Insomnia    Osteopenia    Plantar fasciitis    Past Surgical History:  Procedure Laterality Date   CHOLECYSTECTOMY     TONSILLECTOMY AND ADENOIDECTOMY     VAGINAL HYSTERECTOMY     Patient Active Problem List   Diagnosis Date Noted   Right thyroid  nodule 09/06/2014   Precordial chest pain 07/15/2014   Abnormality of gait 10/26/2013   Sinus tarsi syndrome of left ankle 09/09/2013    PCP: Oneil Neth   REFERRING PROVIDER: Asberry Kobs   REFERRING DIAG:  M76.71 (ICD-10-CM) - Peroneal tendinitis, right leg   Z48.89 (ICD-10-CM) - Encounter for other specified surgical aftercare     THERAPY DIAG:  S/P left knee arthroscopy  Muscle weakness (generalized)  Pain in right ankle and joints of right foot  Other abnormalities of gait and mobility  Trochanteric bursitis of right hip  Pain in right hip  Rationale for Evaluation and Treatment: Rehabilitation  ONSET DATE: 12/24/22  SUBJECTIVE:   SUBJECTIVE STATEMENT: Pt noted that her left knee was feeling extra stiff today. 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  PERTINENT HISTORY: 12/24/22- s/p left knee arthroscopy with medial meniscal debridement R hip trochanteric bursitis  12/03/22- R lateral ankle sprain w/peroneal tendinitis   PAIN:  Are you having pain? No, noted some soreness in  the left knee  PRECAUTIONS: None  RED FLAGS: None   WEIGHT BEARING RESTRICTIONS: No  FALLS:  Has patient fallen in last 6 months? 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   PATIENT SURVEYS:  FOTO 49  COGNITION: Overall cognitive status: Within functional limits for tasks assessed     SENSATION: WFL   MUSCLE LENGTH: Hamstrings: BLE tightness  POSTURE: No Significant postural limitations  PALPATION: No TTP   LOWER EXTREMITY ROM: All WNL   LOWER EXTREMITY MMT: 4/5 BLE    FUNCTIONAL TESTS:  5 times sit to stand: 19.36s Timed up and go (TUG): 13.38s   GAIT: Distance walked: in clinic distances Assistive device utilized: None Level of assistance: Modified independence Comments: decreased stance time, decreased stride length, trendelenburg  TREATMENT DATE: 02/24/23- EVAL   03/26/23 Bike L2x97min - stiff at first that decreased as progressed through the 6 min Prone Hip Extension 2x8 L, 1x8 R Side Lying Hip Abduction 2x8 each leg, don't lie on right hip for too long, right knee bent for this exercise due to bursitis LAQ 3# 1x12 each leg, 5# 1x8 each leg  Seated Marches with 5# 2x20, feels a pull in her lower back  Supine Trunk rotations  03/19/23 NuStep L5x28min LAQ 3# 2x10  SAQ 1/2 foam roller under knee, cuing for full knee  extension 1x10 Banded Side Steps Green band 3x10 each leg around knees, 1x10 around ankles Standing Hip Extensions 1x10 left leg Prone hip Extensions 1x10 on each leg  03/17/23 Bike L2x62min Leg Extension 5# 2x8 HS Curls 10# 1x8, 15# 1x8 Banded Side Steps green band 8 lengths of parallel bars  6 inch Step Ups 2x10 each side STS + walk 20 ft x 3 lengths  03/11/23 NuStep L5x19min Seated Marches 2x20, cued to sit up tall STS 2x10 Seated Hip abduction 2x12, green band  6 inch step ups 2x10 Slant Board Stretch 3x30 seconds Calf raises 2x10 Ant Tib Raises 2x10  6 inch stairs 2x5 up and down   STS + walk 10 ft x 4 lengths Ionto patch to right hip (#1)  03/06/23 NuStep L5 x LAQ 2x10 each leg HS green band 2x10 Hip Flexion green band 2x8, cued to sit up tall Hip Adduction with ball  2x12 Step ups 4 inch 1x10 4 inch stairs 2x5 up and down 6 inch stairs 1x5 up and down   PATIENT EDUCATION:  Education details: POC and HEP Person educated: Patient Education method: Explanation Education comprehension: verbalized understanding  HOME EXERCISE PROGRAM: Access Code: [email or web address] URL: [email or web address] Date: 02/24/2023 Prepared by: Almetta Fam  Exercises - Heel Raises with Counter Support  - 1 x daily - 7 x weekly - 2 sets - 10 reps - Sit to Stand  - 1 x daily - 7 x weekly - 2 sets - 10 reps - Seated Hip Abduction with Resistance  - 1 x daily - 7 x weekly - 2 sets - 10 reps - Seated Knee Extension with Resistance  - 1 x daily - 7 x weekly - 2 sets - 10 reps Supine trunk rotations - 1 x daily - 7 x weekly - 2 sets - 10 reps  ASSESSMENT:  CLINICAL IMPRESSION: Patient is a 73 y.o. female who was seen today for physical therapy treatment for R hip and ankle pain and L knee pain. She tolerated exercises well, but did not feel her glutes working with the prone hip extensions. We have tried standing hip extension, but she wasn't able to tolerate it because of her knee  pain. We were able to do side lying hip abduction, but had to bend the knee for the right side. She does note have bursitis in her right hip, so she isn't able to be laying on her right side for a long period of time. We ended the session with some trunk rotations after she said she had some back pain, next session we will work more on posterior chain strengthening and core strength. Pt will benefit from skilled PT to adjust exercises based on her needs and for general strengthening to support her joints.   OBJECTIVE IMPAIRMENTS: Abnormal gait, decreased activity tolerance, decreased balance, decreased ROM, decreased strength, improper body mechanics, and pain.   ACTIVITY LIMITATIONS: sitting, standing, squatting, stairs, transfers,  and locomotion level  PARTICIPATION LIMITATIONS: driving, shopping, community activity, and yard work  [facility] POTENTIAL: Good  CLINICAL DECISION MAKING: Stable/uncomplicated  EVALUATION COMPLEXITY: Low   GOALS: Goals reviewed with patient? Yes  SHORT TERM GOALS: Target date:04/07/23  Patient will be independent with initial HEP. Baseline: given 02/24/23 Goal status: INITIAL  2.  Patient will demonstrate decreased fall risk by scoring < 12 sec on TUG. Baseline: 13.38s Goal status: INITIAL   LONG TERM GOALS: Target date: 05/19/23  Patient will be independent with advanced/ongoing HEP to improve outcomes and carryover.  Baseline:  Goal status: INITIAL  2.  Patient will be able to ambulate 600' with normalized gait pattern and good safety to access community.  Baseline: antalgic gait  Goal status: INITIAL  3.  Patient will be able to navigate 2 flights of steps without increase in pain .  Baseline: pain with stairs  Goal status: INITIAL   4.  Patient will demonstrate improved functional LE strength as demonstrated by 5xSTS without UE use from chair height <15s. Baseline: 19.36s Goal status: INITIAL  5.  Patient will report pain levels <2/10 for hip,  knee and ankle.  Baseline: 4/10, 1/10 03/01/13 Goal status: MET   PT FREQUENCY: 2x/week  PT DURATION: 12 weeks  PLANNED INTERVENTIONS: 97110-Therapeutic exercises, 97530- Therapeutic activity, 97112- Neuromuscular re-education, 97535- Self Care, 02859- Manual therapy, [phone]- Gait training, 97014- Electrical stimulation (unattended), [phone]- Electrical stimulation (manual), [id number]- Ultrasound, 02966- Ionotophoresis 4mg /ml Dexamethasone, Patient/Family education, Balance training, Stair training, Taping, Dry Needling, Joint mobilization, Spinal mobilization, Cryotherapy, and Moist heat  PLAN FOR NEXT SESSION: start with hip and knee strengthening, can try US  or ionto or vaso for ankle swelling and tendonitis, STS, stair negotiation, core and posterior chain strengthening   Larraine Nordmann, Student-PT 03/26/2023, 2:01 PM

## 2023-04-02 ENCOUNTER — Ambulatory Visit: Payer: PPO

## 2023-04-09 ENCOUNTER — Ambulatory Visit: Payer: PPO

## 2023-04-09 DIAGNOSIS — M7061 Trochanteric bursitis, right hip: Secondary | ICD-10-CM

## 2023-04-09 DIAGNOSIS — M6281 Muscle weakness (generalized): Secondary | ICD-10-CM

## 2023-04-09 DIAGNOSIS — Z9889 Other specified postprocedural states: Secondary | ICD-10-CM

## 2023-04-09 DIAGNOSIS — R2689 Other abnormalities of gait and mobility: Secondary | ICD-10-CM

## 2023-04-09 DIAGNOSIS — M25551 Pain in right hip: Secondary | ICD-10-CM

## 2023-04-09 DIAGNOSIS — M25571 Pain in right ankle and joints of right foot: Secondary | ICD-10-CM

## 2023-04-09 NOTE — Therapy (Signed)
OUTPATIENT PHYSICAL THERAPY LOWER EXTREMITY TREATMENT   Patient Name: Teresa Lamb MRN: [id number] DOB:1950-07-29, 73 y.o., female Today's Date: 04/09/2023  END OF SESSION:  PT End of Session - 04/09/23 1101     Visit Number 7    Date for PT Re-Evaluation 05/19/23    Authorization Type HTA    PT Start Time 1100    PT Stop Time 1145    PT Time Calculation (min) 45 min    Activity Tolerance Patient tolerated treatment well    Behavior During Therapy WFL for tasks assessed/performed                 Past Medical History:  Diagnosis Date   Hyperlipemia    Insomnia    Osteopenia    Plantar fasciitis    Past Surgical History:  Procedure Laterality Date   CHOLECYSTECTOMY     TONSILLECTOMY AND ADENOIDECTOMY     VAGINAL HYSTERECTOMY     Patient Active Problem List   Diagnosis Date Noted   Right thyroid nodule 09/06/2014   Precordial chest pain 07/15/2014   Abnormality of gait 10/26/2013   Sinus tarsi syndrome of left ankle 09/09/2013    PCP: Rodrigo Ran   REFERRING PROVIDER: Eartha Inch   REFERRING DIAG:  M76.71 (ICD-10-CM) - Peroneal tendinitis, right leg   Z48.89 (ICD-10-CM) - Encounter for other specified surgical aftercare     THERAPY DIAG:  S/P left knee arthroscopy  Muscle weakness (generalized)  Pain in right ankle and joints of right foot  Other abnormalities of gait and mobility  Pain in right hip  Trochanteric bursitis of right hip  Rationale for Evaluation and Treatment: Rehabilitation  ONSET DATE: 12/24/22  SUBJECTIVE:   SUBJECTIVE STATEMENT: My leg has been sore and hurting from the laying down and lifting my leg exercise. 4/10 if I have to walk or lift it.   PERTINENT HISTORY: 12/24/22- s/p left knee arthroscopy with medial meniscal debridement R hip trochanteric bursitis  12/03/22- R lateral ankle sprain w/peroneal tendinitis   PAIN:  Are you having pain? No, noted some soreness in the left knee  PRECAUTIONS:  None  RED FLAGS: None   WEIGHT BEARING RESTRICTIONS: No  FALLS:  Has patient fallen in last 6 months? 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   PATIENT SURVEYS:  FOTO 49  COGNITION: Overall cognitive status: Within functional limits for tasks assessed     SENSATION: WFL   MUSCLE LENGTH: Hamstrings: BLE tightness  POSTURE: No Significant postural limitations  PALPATION: No TTP   LOWER EXTREMITY ROM: All WNL   LOWER EXTREMITY MMT: 4/5 BLE    FUNCTIONAL TESTS:  5 times sit to stand: 19.36s Timed up and go (TUG): 13.38s   GAIT: Distance walked: in clinic distances Assistive device utilized: None Level of assistance: Modified independence Comments: decreased stance time, decreased stride length, trendelenburg  TREATMENT DATE: 02/24/23- EVAL   04/09/23 Recheck goals NuStep L4x6mins LE only Supine LTR Feet on pball rotations, knees to chest, small bridges  Single knee bent fallouts supine  HS, SKTC, IT band stretches 30s each side  STS 2x10 elevated mat table  Calf stretch 30s x2 on slant board  03/26/23 Bike L2x30min - stiff at first that decreased as progressed through the 6 min Prone Hip Extension 2x8 L, 1x8 R Side Lying Hip Abduction 2x8 each leg, don't lie on right hip for too long, right knee bent for this exercise due  to bursitis LAQ 3# 1x12 each leg, 5# 1x8 each leg  Seated Marches with 5# 2x20, feels a pull in her lower back  Supine Trunk rotations  03/19/23 NuStep L5x69min LAQ 3# 2x10  SAQ 1/2 foam roller under knee, cuing for full knee extension 1x10 Banded Side Steps Green band 3x10 each leg around knees, 1x10 around ankles Standing Hip Extensions 1x10 left leg Prone hip Extensions 1x10 on each leg  03/17/23 Bike L2x50min Leg Extension 5# 2x8 HS Curls 10# 1x8, 15# 1x8 Banded Side Steps green band 8 lengths of parallel bars  6 inch Step Ups 2x10 each side STS + walk 20 ft x 3 lengths  03/11/23 NuStep L5x44min Seated Marches 2x20, cued to sit up tall STS 2x10 Seated Hip abduction 2x12, green band  6 inch step ups 2x10 Slant Board Stretch 3x30 seconds Calf raises 2x10 Ant Tib Raises 2x10  6 inch stairs 2x5 up and down   STS + walk 10 ft x 4 lengths Ionto patch to right hip (#1)  03/06/23 NuStep L5 x LAQ 2x10 each leg HS green band 2x10 Hip Flexion green band 2x8, cued to sit up tall Hip Adduction with ball  2x12 Step ups 4 inch 1x10 4 inch stairs 2x5 up and down 6 inch stairs 1x5 up and down   PATIENT EDUCATION:  Education details: POC and HEP Person educated: Patient Education method: Explanation Education comprehension: verbalized understanding  HOME EXERCISE PROGRAM: Access Code: [email or web address] URL: [email or web address] Date: 02/24/2023 Prepared by: Cassie Freer  Exercises - Heel Raises with Counter Support  - 1 x daily - 7 x weekly - 2 sets - 10 reps - Sit to Stand  - 1 x daily - 7 x weekly - 2 sets - 10 reps - Seated Hip Abduction with Resistance  - 1 x daily - 7 x weekly - 2 sets - 10 reps - Seated Knee Extension with Resistance  - 1 x daily - 7 x weekly - 2 sets - 10 reps Supine trunk rotations - 1 x daily - 7 x weekly - 2 sets - 10 reps  ASSESSMENT:  CLINICAL IMPRESSION: Patient is a 73 y.o. female who was seen today for physical therapy treatment  for R hip and ankle pain and L knee pain. She enters today with a limp, reports that it may be from doing sidelying hip abduction last time she was in here. Was advised to not repeat this exercise due to increased pain. Recheck STG-- TUG not met but may be due to increased pain and limping today. STS were hard from lower surface, was able to do from elevated mat table. Some soreness and pain noted with IT band stretching and SKTC on R side. Pt will benefit from skilled PT to adjust exercises based on her needs and for general strengthening to support her joints.    OBJECTIVE IMPAIRMENTS: Abnormal gait, decreased activity tolerance, decreased balance, decreased  ROM, decreased strength, improper body mechanics, and pain.   ACTIVITY LIMITATIONS: sitting, standing, squatting, stairs, transfers, and locomotion level  PARTICIPATION LIMITATIONS: driving, shopping, community activity, and yard work  [facility] POTENTIAL: Good  CLINICAL DECISION MAKING: Stable/uncomplicated  EVALUATION COMPLEXITY: Low   GOALS: Goals reviewed with patient? Yes  SHORT TERM GOALS: Target date:04/07/23  Patient will be independent with initial HEP. Baseline: given 02/24/23 Goal status: MET  2.  Patient will demonstrate decreased fall risk by scoring < 12 sec on TUG. Baseline: 13.38s Goal status: IN PROGRESS 14s w/antalgic gait    LONG TERM GOALS: Target date: 05/19/23  Patient will be independent with advanced/ongoing HEP to improve outcomes and carryover.  Baseline:  Goal status: INITIAL  2.  Patient will be able to ambulate 600' with normalized gait pattern and good safety to access community.  Baseline: antalgic gait  Goal status: IN PROGRESS still limping 04/09/23  3.  Patient will be able to navigate 2 flights of steps without increase in pain .  Baseline: pain with stairs  Goal status: INITIAL   4.  Patient will demonstrate improved functional LE strength as demonstrated by 5xSTS without UE use from chair  height <15s. Baseline: 19.36s Goal status: INITIAL  5.  Patient will report pain levels <2/10 for hip, knee and ankle.  Baseline: 4/10, 1/10 03/01/13 Goal status: MET   PT FREQUENCY: 2x/week  PT DURATION: 12 weeks  PLANNED INTERVENTIONS: 97110-Therapeutic exercises, 97530- Therapeutic activity, 97112- Neuromuscular re-education, 97535- Self Care, 96045- Manual therapy, [phone]- Gait training, 97014- Electrical stimulation (unattended), [phone]- Electrical stimulation (manual), [id number]- Ultrasound, 82956- Ionotophoresis 4mg /ml Dexamethasone, Patient/Family education, Balance training, Stair training, Taping, Dry Needling, Joint mobilization, Spinal mobilization, Cryotherapy, and Moist heat  PLAN FOR NEXT SESSION: start with hip and knee strengthening, can try [place] or ionto or vaso for ankle swelling and tendonitis, STS, stair negotiation, core and posterior chain strengthening   Cassie Freer, PT 04/09/2023, 11:42 AM

## 2023-04-15 NOTE — Therapy (Signed)
 OUTPATIENT PHYSICAL THERAPY LOWER EXTREMITY TREATMENT   Patient Name: Teresa Lamb MRN: [id number] DOB:06/06/1950, 73 y.o., female Today's Date: 04/16/2023  END OF SESSION:  PT End of Session - 04/16/23 0848     Visit Number 8    Date for PT Re-Evaluation 05/19/23    Authorization Type HTA    PT Start Time 0848    PT Stop Time 0930    PT Time Calculation (min) 42 min    Activity Tolerance Patient tolerated treatment well    Behavior During Therapy WFL for tasks assessed/performed                  Past Medical History:  Diagnosis Date   Hyperlipemia    Insomnia    Osteopenia    Plantar fasciitis    Past Surgical History:  Procedure Laterality Date   CHOLECYSTECTOMY     TONSILLECTOMY AND ADENOIDECTOMY     VAGINAL HYSTERECTOMY     Patient Active Problem List   Diagnosis Date Noted   Right thyroid nodule 09/06/2014   Precordial chest pain 07/15/2014   Abnormality of gait 10/26/2013   Sinus tarsi syndrome of left ankle 09/09/2013    PCP: Rodrigo Ran   REFERRING PROVIDER: Eartha Inch   REFERRING DIAG:  M76.71 (ICD-10-CM) - Peroneal tendinitis, right leg   Z48.89 (ICD-10-CM) - Encounter for other specified surgical aftercare     THERAPY DIAG:  S/P left knee arthroscopy  Muscle weakness (generalized)  Pain in right ankle and joints of right foot  Other abnormalities of gait and mobility  Pain in right hip  Trochanteric bursitis of right hip  Rationale for Evaluation and Treatment: Rehabilitation  ONSET DATE: 12/24/22  SUBJECTIVE:   SUBJECTIVE STATEMENT: I have been having a whole lot of trouble with my hip. It is not getting better. The ankle and knee feels good. It is very difficult to sleep.   PERTINENT HISTORY: 12/24/22- s/p left knee arthroscopy with medial meniscal debridement R hip trochanteric bursitis  12/03/22- R lateral ankle sprain w/peroneal tendinitis   PAIN:  Are you having pain? No, noted some soreness in the  left knee  PRECAUTIONS: None  RED FLAGS: None   WEIGHT BEARING RESTRICTIONS: No  FALLS:  Has patient fallen in last 6 months? 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   PATIENT SURVEYS:  FOTO 49  COGNITION: Overall cognitive status: Within functional limits for tasks assessed     SENSATION: WFL   MUSCLE LENGTH: Hamstrings: BLE tightness  POSTURE: No Significant postural limitations  PALPATION: No TTP   LOWER EXTREMITY ROM: All WNL   LOWER EXTREMITY MMT: 4/5 BLE    FUNCTIONAL TESTS:  5 times sit to stand: 19.36s Timed up and go (TUG): 13.38s   GAIT: Distance walked: in clinic distances Assistive device utilized: None Level of assistance: Modified independence Comments: decreased stance time, decreased stride length, trendelenburg  TREATMENT DATE: 02/24/23- EVAL   04/16/23 LAQ 3# 2x10  Standing marches 3# 10 reps alt, pain with weight bearing on RLE  HS curls green 2x10 NuStep L5x109mins Calf raises 2x10 Calf stretch on black bar 15s x2  Ionto patch to R greater trochanter #2     04/09/23 Recheck goals NuStep L4x27mins LE only Supine LTR Feet on pball rotations, knees to chest, small bridges  Single knee bent fallouts supine  HS, SKTC, IT band stretches 30s each side  STS 2x10 elevated mat table  Calf stretch 30s x2  on slant board  03/26/23 Bike L2x70min - stiff at first that decreased as progressed through the 6 min Prone Hip Extension 2x8 L, 1x8 R Side Lying Hip Abduction 2x8 each leg, don't lie on right hip for too long, right knee bent for this exercise due to bursitis LAQ 3# 1x12 each leg, 5# 1x8 each leg  Seated Marches with 5# 2x20, feels a pull in her lower back  Supine Trunk rotations  03/19/23 NuStep L5x55min LAQ 3# 2x10  SAQ 1/2 foam roller under knee, cuing for full knee extension 1x10 Banded Side Steps Green band 3x10 each leg around knees, 1x10 around ankles Standing Hip Extensions 1x10 left leg Prone hip Extensions 1x10 on each leg  03/17/23 Bike L2x34min Leg Extension 5# 2x8 HS Curls 10# 1x8, 15# 1x8 Banded Side Steps green band 8 lengths of parallel bars  6 inch Step Ups 2x10 each side STS + walk 20 ft x 3 lengths  03/11/23 NuStep L5x4min Seated Marches 2x20, cued to sit up tall STS 2x10 Seated Hip abduction 2x12, green band  6 inch step ups 2x10 Slant Board Stretch 3x30 seconds Calf raises 2x10 Ant Tib Raises 2x10  6 inch stairs 2x5 up and down   STS + walk 10 ft x 4 lengths Ionto patch to right hip (#1)  03/06/23 NuStep L5 x LAQ 2x10 each leg HS green band 2x10 Hip Flexion green band 2x8, cued to sit up tall Hip Adduction with ball  2x12 Step ups 4 inch 1x10 4 inch stairs 2x5 up and down 6 inch stairs 1x5 up and down   PATIENT EDUCATION:  Education details: POC and HEP Person educated: Patient Education method: Explanation Education comprehension: verbalized understanding  HOME EXERCISE PROGRAM: Access Code: [email or web address] URL: [email or web address] Date: 02/24/2023 Prepared by: Cassie Freer  Exercises - Heel Raises with Counter Support  - 1 x daily - 7 x weekly - 2 sets - 10 reps - Sit to Stand  - 1 x daily - 7 x weekly - 2 sets - 10 reps - Seated Hip Abduction with Resistance  - 1 x daily - 7 x weekly - 2 sets - 10 reps - Seated Knee  Extension with Resistance  - 1 x daily - 7 x weekly - 2 sets - 10 reps Supine trunk rotations - 1 x daily - 7 x weekly - 2 sets - 10 reps  ASSESSMENT:  CLINICAL IMPRESSION: Patient is a 73 y.o. female who was seen today for physical therapy treatment for R hip and ankle pain and L knee pain. She continues to have R hip pain but is doing well with knee and ankle. The hip does not seem to be getting better, and she thinks it may be getting worse. She does have more of a limp. Was advised to go back to ortho and get an MRI to rule out any possible tears. She repots the patch was really helpful the  one time we did it, so ended with another one today. Pt will benefit from skilled PT to adjust exercises based on her needs and for general strengthening to support her joints.     OBJECTIVE IMPAIRMENTS: Abnormal gait, decreased activity tolerance, decreased balance, decreased ROM, decreased strength, improper body mechanics, and pain.   ACTIVITY LIMITATIONS: sitting, standing, squatting, stairs, transfers, and locomotion level  PARTICIPATION LIMITATIONS: driving, shopping, community activity, and yard work  [facility] POTENTIAL: Good  CLINICAL DECISION MAKING: Stable/uncomplicated  EVALUATION COMPLEXITY: Low   GOALS: Goals reviewed with patient? Yes  SHORT TERM GOALS: Target date:04/07/23  Patient will be independent with initial HEP. Baseline: given 02/24/23 Goal status: MET  2.  Patient will demonstrate decreased fall risk by scoring < 12 sec on TUG. Baseline: 13.38s Goal status: IN PROGRESS 14s w/antalgic gait    LONG TERM GOALS: Target date: 05/19/23  Patient will be independent with advanced/ongoing HEP to improve outcomes and carryover.  Baseline:  Goal status: INITIAL  2.  Patient will be able to ambulate 600' with normalized gait pattern and good safety to access community.  Baseline: antalgic gait  Goal status: IN PROGRESS still limping 04/09/23  3.  Patient will be able to  navigate 2 flights of steps without increase in pain .  Baseline: pain with stairs  Goal status: INITIAL   4.  Patient will demonstrate improved functional LE strength as demonstrated by 5xSTS without UE use from chair height <15s. Baseline: 19.36s Goal status: INITIAL  5.  Patient will report pain levels <2/10 for hip, knee and ankle.  Baseline: 4/10, 1/10 03/01/13 Goal status: MET   PT FREQUENCY: 2x/week  PT DURATION: 12 weeks  PLANNED INTERVENTIONS: 97110-Therapeutic exercises, 97530- Therapeutic activity, 97112- Neuromuscular re-education, 97535- Self Care, 60454- Manual therapy, [phone]- Gait training, 97014- Electrical stimulation (unattended), [phone]- Electrical stimulation (manual), 97035- Ultrasound, 29562- Ionotophoresis 4mg /ml Dexamethasone, Patient/Family education, Balance training, Stair training, Taping, Dry Needling, Joint mobilization, Spinal mobilization, Cryotherapy, and Moist heat  PLAN FOR NEXT SESSION: start with hip and knee strengthening, can try [place] or ionto or vaso for ankle swelling and tendonitis, STS, stair negotiation, core and posterior chain strengthening   Cassie Freer, PT 04/16/2023, 9:29 AM

## 2023-04-16 ENCOUNTER — Ambulatory Visit: Payer: PPO

## 2023-04-16 DIAGNOSIS — Z9889 Other specified postprocedural states: Secondary | ICD-10-CM | POA: Diagnosis not present

## 2023-04-16 DIAGNOSIS — R2689 Other abnormalities of gait and mobility: Secondary | ICD-10-CM

## 2023-04-16 DIAGNOSIS — M7061 Trochanteric bursitis, right hip: Secondary | ICD-10-CM

## 2023-04-16 DIAGNOSIS — M6281 Muscle weakness (generalized): Secondary | ICD-10-CM

## 2023-04-16 DIAGNOSIS — M25571 Pain in right ankle and joints of right foot: Secondary | ICD-10-CM

## 2023-04-16 DIAGNOSIS — M25551 Pain in right hip: Secondary | ICD-10-CM

## 2023-05-17 DIAGNOSIS — M25551 Pain in right hip: Secondary | ICD-10-CM | POA: Diagnosis not present

## 2023-05-29 DIAGNOSIS — M25551 Pain in right hip: Secondary | ICD-10-CM | POA: Diagnosis not present

## 2023-05-29 DIAGNOSIS — M7061 Trochanteric bursitis, right hip: Secondary | ICD-10-CM | POA: Diagnosis not present

## 2023-06-30 DIAGNOSIS — M25551 Pain in right hip: Secondary | ICD-10-CM | POA: Diagnosis not present

## 2023-06-30 DIAGNOSIS — G8929 Other chronic pain: Secondary | ICD-10-CM | POA: Diagnosis not present

## 2023-07-16 DIAGNOSIS — M7061 Trochanteric bursitis, right hip: Secondary | ICD-10-CM | POA: Diagnosis not present

## 2023-07-16 DIAGNOSIS — M25551 Pain in right hip: Secondary | ICD-10-CM | POA: Diagnosis not present

## 2023-07-21 DIAGNOSIS — M25551 Pain in right hip: Secondary | ICD-10-CM | POA: Diagnosis not present

## 2023-07-24 DIAGNOSIS — R351 Nocturia: Secondary | ICD-10-CM | POA: Diagnosis not present

## 2023-07-24 DIAGNOSIS — N3281 Overactive bladder: Secondary | ICD-10-CM | POA: Diagnosis not present

## 2023-07-25 DIAGNOSIS — Z13220 Encounter for screening for lipoid disorders: Secondary | ICD-10-CM | POA: Diagnosis not present

## 2023-07-25 DIAGNOSIS — Z1331 Encounter for screening for depression: Secondary | ICD-10-CM | POA: Diagnosis not present

## 2023-07-25 DIAGNOSIS — Z1231 Encounter for screening mammogram for malignant neoplasm of breast: Secondary | ICD-10-CM | POA: Diagnosis not present

## 2023-07-25 DIAGNOSIS — Z1159 Encounter for screening for other viral diseases: Secondary | ICD-10-CM | POA: Diagnosis not present

## 2023-07-25 DIAGNOSIS — E2839 Other primary ovarian failure: Secondary | ICD-10-CM | POA: Diagnosis not present

## 2023-07-25 DIAGNOSIS — Z23 Encounter for immunization: Secondary | ICD-10-CM | POA: Diagnosis not present

## 2023-07-25 DIAGNOSIS — Z0000 Encounter for general adult medical examination without abnormal findings: Secondary | ICD-10-CM | POA: Diagnosis not present

## 2023-07-25 DIAGNOSIS — Z79899 Other long term (current) drug therapy: Secondary | ICD-10-CM | POA: Diagnosis not present

## 2023-07-25 DIAGNOSIS — B372 Candidiasis of skin and nail: Secondary | ICD-10-CM | POA: Diagnosis not present

## 2023-07-25 DIAGNOSIS — Z136 Encounter for screening for cardiovascular disorders: Secondary | ICD-10-CM | POA: Diagnosis not present

## 2023-07-25 DIAGNOSIS — R7303 Prediabetes: Secondary | ICD-10-CM | POA: Diagnosis not present

## 2023-07-25 DIAGNOSIS — Z7185 Encounter for immunization safety counseling: Secondary | ICD-10-CM | POA: Diagnosis not present

## 2023-07-27 ENCOUNTER — Other Ambulatory Visit (HOSPITAL_BASED_OUTPATIENT_CLINIC_OR_DEPARTMENT_OTHER): Payer: Self-pay | Admitting: Family Medicine

## 2023-07-27 DIAGNOSIS — E2839 Other primary ovarian failure: Secondary | ICD-10-CM

## 2023-07-27 IMAGING — MG MM DIGITAL SCREENING BILAT W/ TOMO AND CAD
8 series · 8 of 24 positions shown · non-contrast
Comparison: Previous exam(s).

CLINICAL DATA: Screening.

EXAM:
DIGITAL SCREENING BILATERAL MAMMOGRAM WITH TOMOSYNTHESIS AND CAD
TECHNIQUE: Bilateral screening digital craniocaudal and mediolateral oblique
mammograms were obtained. Bilateral screening digital breast
tomosynthesis was performed. The images were evaluated with
computer-aided detection.

[L CC synth-2D]
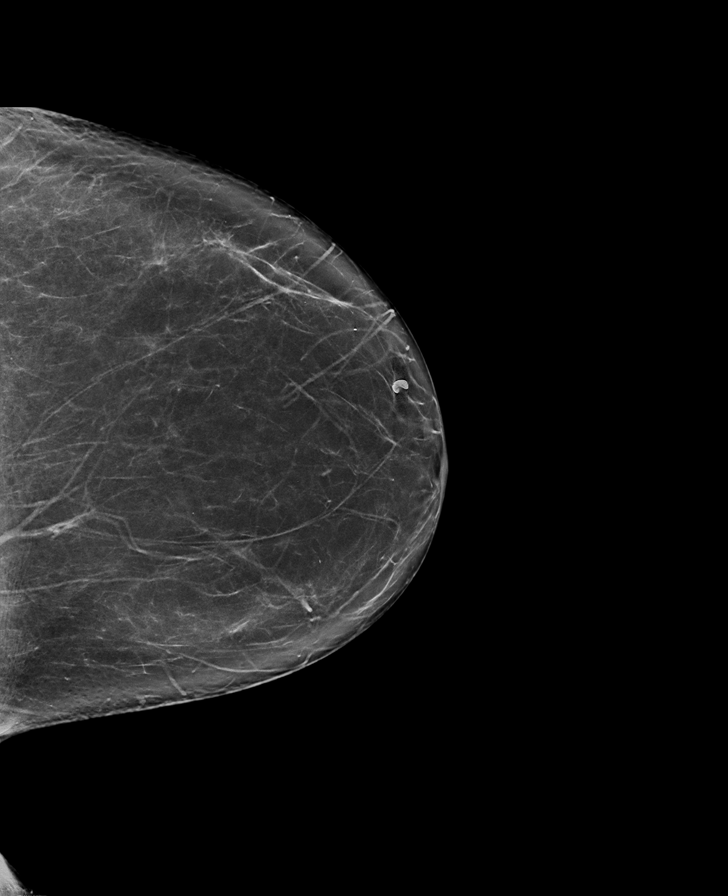

[R CC synth-2D]
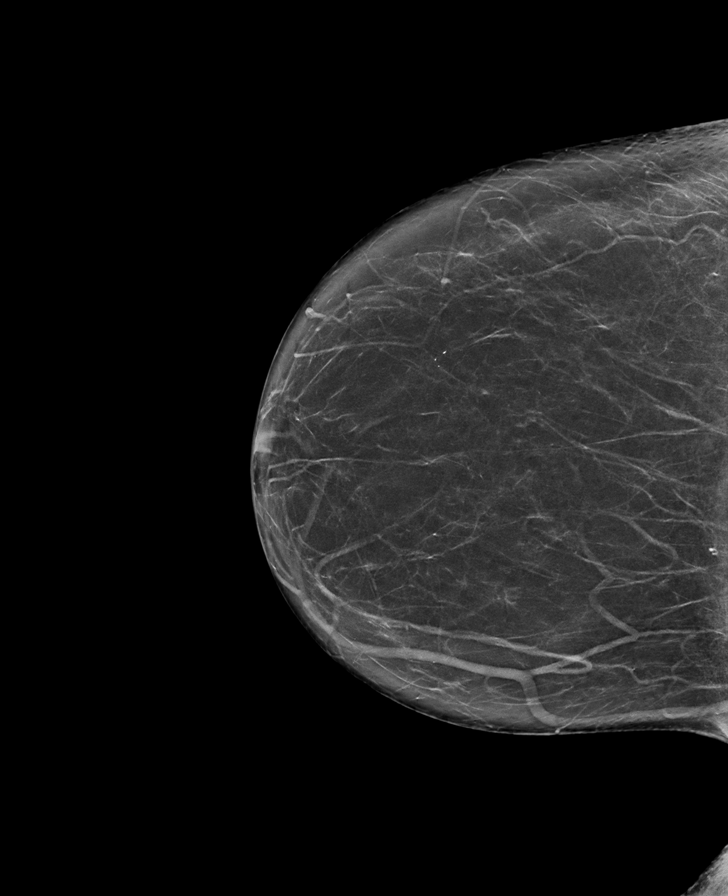

[R MLO synth-2D]
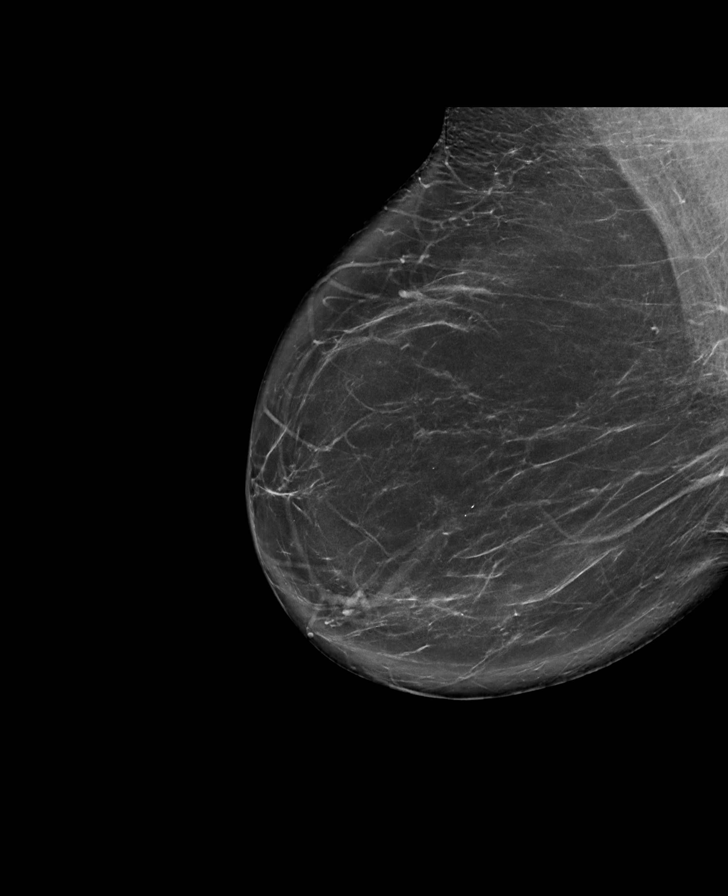

[L MLO synth-2D]
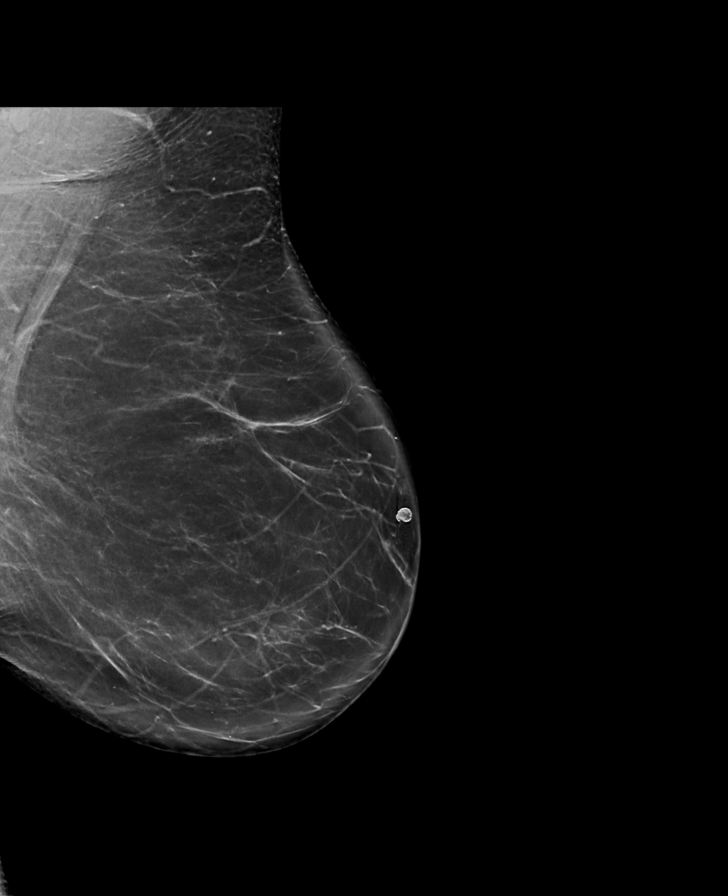

[L CC tomo · tomo slice 39/78.0]
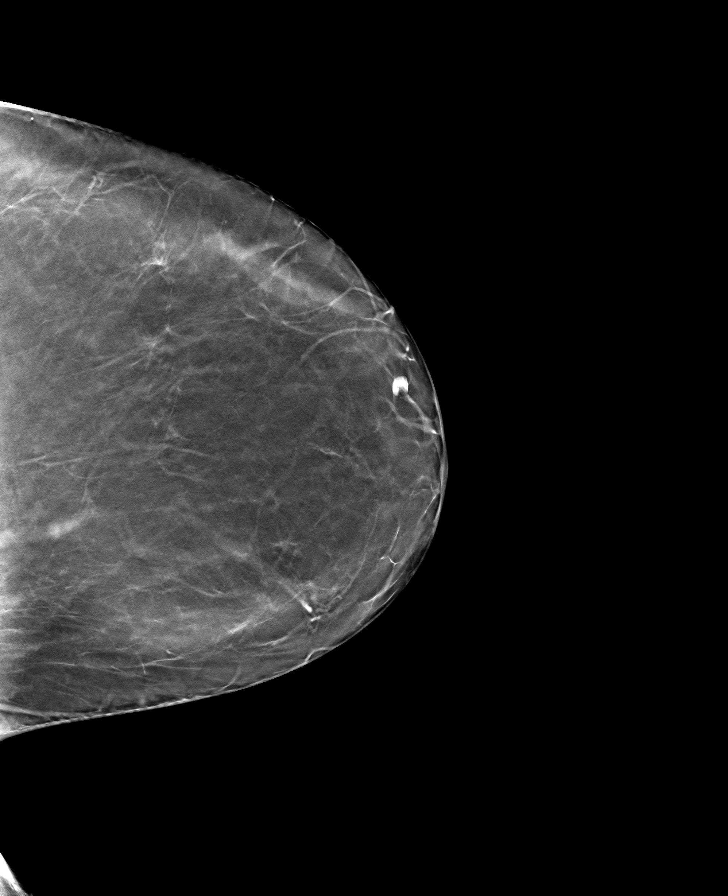

[R CC tomo · tomo slice 38/75.0]
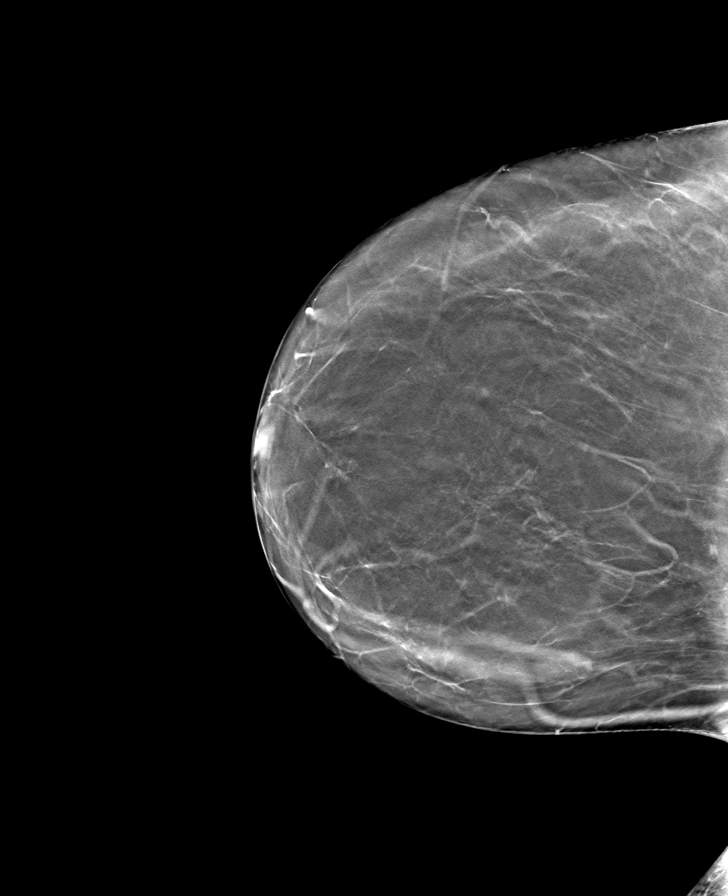

[R MLO tomo · tomo slice 45/90.0]
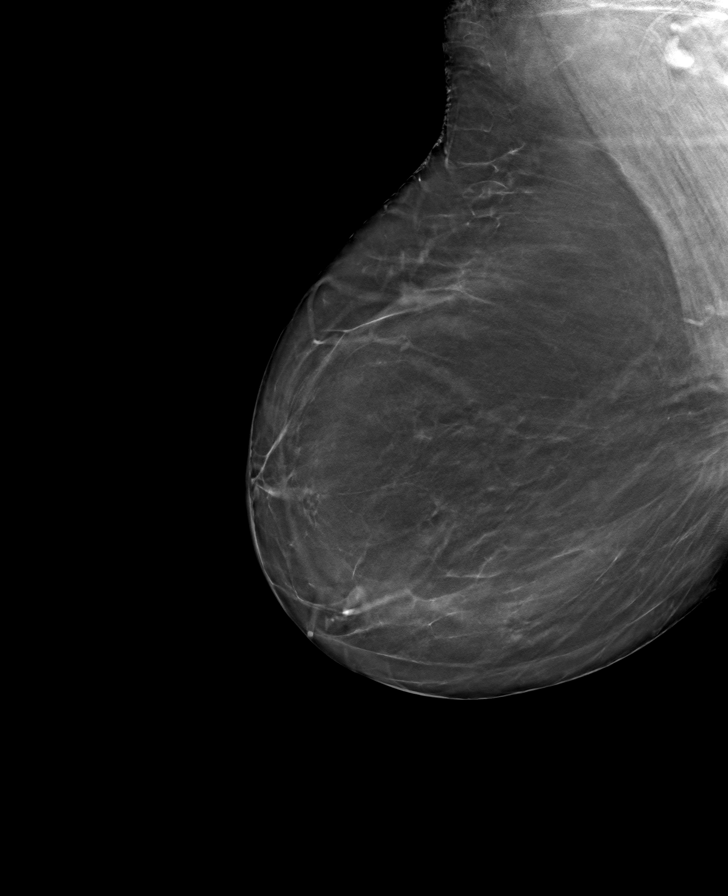

[L MLO tomo · tomo slice 47/93.0]
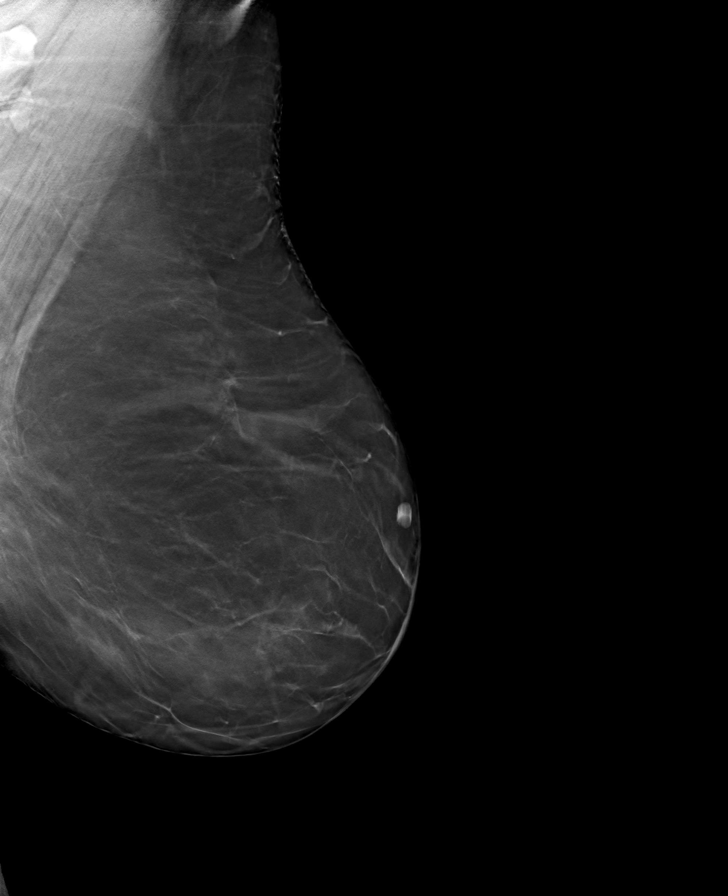

[8 of 24 positions shown; findings below may reference images not displayed]

ACR Breast Density Category b: There are scattered areas of
fibroglandular density.
FINDINGS: There are no findings suspicious for malignancy.
IMPRESSION: No mammographic evidence of malignancy. A result letter of this
screening mammogram will be mailed directly to the patient.

RECOMMENDATION:
Screening mammogram in one year. (Code:51-O-LD2)

BI-RADS CATEGORY  1: Negative.

## 2023-07-29 ENCOUNTER — Other Ambulatory Visit: Payer: Self-pay | Admitting: Family Medicine

## 2023-07-29 DIAGNOSIS — Z1231 Encounter for screening mammogram for malignant neoplasm of breast: Secondary | ICD-10-CM

## 2023-08-08 DIAGNOSIS — M25551 Pain in right hip: Secondary | ICD-10-CM | POA: Diagnosis not present

## 2023-08-08 DIAGNOSIS — M7061 Trochanteric bursitis, right hip: Secondary | ICD-10-CM | POA: Diagnosis not present

## 2023-08-19 DIAGNOSIS — M25551 Pain in right hip: Secondary | ICD-10-CM | POA: Diagnosis not present

## 2023-08-25 DIAGNOSIS — M25551 Pain in right hip: Secondary | ICD-10-CM | POA: Diagnosis not present

## 2023-08-27 DIAGNOSIS — M25551 Pain in right hip: Secondary | ICD-10-CM | POA: Diagnosis not present

## 2023-09-08 DIAGNOSIS — M25551 Pain in right hip: Secondary | ICD-10-CM | POA: Diagnosis not present

## 2023-09-08 DIAGNOSIS — M7061 Trochanteric bursitis, right hip: Secondary | ICD-10-CM | POA: Diagnosis not present

## 2023-09-17 DIAGNOSIS — M7061 Trochanteric bursitis, right hip: Secondary | ICD-10-CM | POA: Diagnosis not present

## 2023-12-08 DIAGNOSIS — D2239 Melanocytic nevi of other parts of face: Secondary | ICD-10-CM | POA: Diagnosis not present

## 2023-12-08 DIAGNOSIS — L570 Actinic keratosis: Secondary | ICD-10-CM | POA: Diagnosis not present

## 2023-12-08 DIAGNOSIS — D1801 Hemangioma of skin and subcutaneous tissue: Secondary | ICD-10-CM | POA: Diagnosis not present

## 2023-12-08 DIAGNOSIS — L820 Inflamed seborrheic keratosis: Secondary | ICD-10-CM | POA: Diagnosis not present

## 2024-01-21 DIAGNOSIS — R7303 Prediabetes: Secondary | ICD-10-CM | POA: Diagnosis not present

## 2024-01-21 DIAGNOSIS — Z23 Encounter for immunization: Secondary | ICD-10-CM | POA: Diagnosis not present

## 2024-01-21 DIAGNOSIS — M1991 Primary osteoarthritis, unspecified site: Secondary | ICD-10-CM | POA: Diagnosis not present

## 2024-01-21 DIAGNOSIS — Z79899 Other long term (current) drug therapy: Secondary | ICD-10-CM | POA: Diagnosis not present

## 2024-02-02 ENCOUNTER — Ambulatory Visit

## 2024-03-09 ENCOUNTER — Ambulatory Visit
Admission: RE | Admit: 2024-03-09 | Discharge: 2024-03-09 | Disposition: A | Source: Ambulatory Visit | Attending: Family Medicine

## 2024-03-09 DIAGNOSIS — Z1231 Encounter for screening mammogram for malignant neoplasm of breast: Secondary | ICD-10-CM

## 2024-03-24 ENCOUNTER — Ambulatory Visit (HOSPITAL_BASED_OUTPATIENT_CLINIC_OR_DEPARTMENT_OTHER)
Admission: RE | Admit: 2024-03-24 | Discharge: 2024-03-24 | Disposition: A | Source: Ambulatory Visit | Attending: Family Medicine

## 2024-03-24 DIAGNOSIS — E2839 Other primary ovarian failure: Secondary | ICD-10-CM
